# Patient Record
Sex: Female | Born: 1935 | Race: White | Hispanic: No | Smoking: Former smoker
Health system: Southern US, Community
[De-identification: ages and names within clinical notes are randomized; demographics above are authoritative.]

## PROBLEM LIST (undated history)

## (undated) DIAGNOSIS — G971 Other reaction to spinal and lumbar puncture: Secondary | ICD-10-CM

## (undated) DIAGNOSIS — C50919 Malignant neoplasm of unspecified site of unspecified female breast: Secondary | ICD-10-CM

## (undated) DIAGNOSIS — E785 Hyperlipidemia, unspecified: Secondary | ICD-10-CM

## (undated) DIAGNOSIS — M549 Dorsalgia, unspecified: Secondary | ICD-10-CM

## (undated) DIAGNOSIS — R42 Dizziness and giddiness: Secondary | ICD-10-CM

## (undated) DIAGNOSIS — M1990 Unspecified osteoarthritis, unspecified site: Secondary | ICD-10-CM

## (undated) DIAGNOSIS — E039 Hypothyroidism, unspecified: Secondary | ICD-10-CM

## (undated) DIAGNOSIS — Z923 Personal history of irradiation: Secondary | ICD-10-CM

## (undated) DIAGNOSIS — I10 Essential (primary) hypertension: Secondary | ICD-10-CM

## (undated) DIAGNOSIS — G8929 Other chronic pain: Secondary | ICD-10-CM

## (undated) DIAGNOSIS — I7300 Raynaud's syndrome without gangrene: Secondary | ICD-10-CM

## (undated) DIAGNOSIS — R011 Cardiac murmur, unspecified: Secondary | ICD-10-CM

## (undated) DIAGNOSIS — G459 Transient cerebral ischemic attack, unspecified: Secondary | ICD-10-CM

## (undated) DIAGNOSIS — S43004D Unspecified dislocation of right shoulder joint, subsequent encounter: Secondary | ICD-10-CM

## (undated) HISTORY — DX: Malignant neoplasm of unspecified site of unspecified female breast: C50.919

## (undated) HISTORY — DX: Unspecified osteoarthritis, unspecified site: M19.90

## (undated) HISTORY — DX: Transient cerebral ischemic attack, unspecified: G45.9

## (undated) HISTORY — PX: TONSILLECTOMY AND ADENOIDECTOMY: SUR1326

## (undated) HISTORY — PX: FINGER SURGERY: SHX640

## (undated) HISTORY — DX: Hypothyroidism, unspecified: E03.9

## (undated) HISTORY — DX: Hyperlipidemia, unspecified: E78.5

## (undated) HISTORY — DX: Essential (primary) hypertension: I10

---

## 1972-09-19 HISTORY — PX: TOTAL ABDOMINAL HYSTERECTOMY: SHX209

## 1999-07-09 ENCOUNTER — Encounter: Payer: Self-pay | Admitting: *Deleted

## 1999-07-09 ENCOUNTER — Ambulatory Visit (HOSPITAL_COMMUNITY): Admission: RE | Admit: 1999-07-09 | Discharge: 1999-07-09 | Payer: Self-pay | Admitting: *Deleted

## 2000-01-10 ENCOUNTER — Encounter: Payer: Self-pay | Admitting: *Deleted

## 2000-01-10 ENCOUNTER — Encounter: Admission: RE | Admit: 2000-01-10 | Discharge: 2000-01-10 | Payer: Self-pay | Admitting: *Deleted

## 2000-07-13 ENCOUNTER — Encounter: Payer: Self-pay | Admitting: *Deleted

## 2000-07-13 ENCOUNTER — Encounter: Admission: RE | Admit: 2000-07-13 | Discharge: 2000-07-13 | Payer: Self-pay | Admitting: *Deleted

## 2000-11-23 ENCOUNTER — Encounter: Payer: Self-pay | Admitting: Orthopedic Surgery

## 2000-11-23 ENCOUNTER — Ambulatory Visit (HOSPITAL_COMMUNITY): Admission: RE | Admit: 2000-11-23 | Discharge: 2000-11-23 | Payer: Self-pay | Admitting: Orthopedic Surgery

## 2000-12-06 ENCOUNTER — Encounter: Admission: RE | Admit: 2000-12-06 | Discharge: 2001-01-12 | Payer: Self-pay | Admitting: Family Medicine

## 2001-03-20 ENCOUNTER — Emergency Department (HOSPITAL_COMMUNITY): Admission: EM | Admit: 2001-03-20 | Discharge: 2001-03-20 | Payer: Self-pay | Admitting: Emergency Medicine

## 2001-03-20 ENCOUNTER — Encounter: Payer: Self-pay | Admitting: Emergency Medicine

## 2001-07-18 ENCOUNTER — Ambulatory Visit (HOSPITAL_COMMUNITY): Admission: RE | Admit: 2001-07-18 | Discharge: 2001-07-18 | Payer: Self-pay | Admitting: Endocrinology

## 2001-07-18 ENCOUNTER — Encounter: Payer: Self-pay | Admitting: Endocrinology

## 2002-03-25 ENCOUNTER — Ambulatory Visit (HOSPITAL_COMMUNITY): Admission: RE | Admit: 2002-03-25 | Discharge: 2002-03-25 | Payer: Self-pay | Admitting: Gastroenterology

## 2002-07-22 ENCOUNTER — Encounter: Payer: Self-pay | Admitting: Endocrinology

## 2002-07-22 ENCOUNTER — Ambulatory Visit (HOSPITAL_COMMUNITY): Admission: RE | Admit: 2002-07-22 | Discharge: 2002-07-22 | Payer: Self-pay | Admitting: Endocrinology

## 2002-07-29 ENCOUNTER — Encounter: Admission: RE | Admit: 2002-07-29 | Discharge: 2002-07-29 | Payer: Self-pay | Admitting: Endocrinology

## 2002-07-29 ENCOUNTER — Encounter: Payer: Self-pay | Admitting: Endocrinology

## 2003-02-04 ENCOUNTER — Encounter: Payer: Self-pay | Admitting: Endocrinology

## 2003-02-04 ENCOUNTER — Encounter: Admission: RE | Admit: 2003-02-04 | Discharge: 2003-02-04 | Payer: Self-pay | Admitting: Endocrinology

## 2003-08-21 ENCOUNTER — Encounter: Admission: RE | Admit: 2003-08-21 | Discharge: 2003-08-21 | Payer: Self-pay | Admitting: Endocrinology

## 2004-09-27 ENCOUNTER — Encounter: Admission: RE | Admit: 2004-09-27 | Discharge: 2004-09-27 | Payer: Self-pay | Admitting: Endocrinology

## 2004-12-01 ENCOUNTER — Encounter (INDEPENDENT_AMBULATORY_CARE_PROVIDER_SITE_OTHER): Payer: Self-pay | Admitting: *Deleted

## 2004-12-01 ENCOUNTER — Ambulatory Visit (HOSPITAL_BASED_OUTPATIENT_CLINIC_OR_DEPARTMENT_OTHER): Admission: RE | Admit: 2004-12-01 | Discharge: 2004-12-01 | Payer: Self-pay | Admitting: Orthopedic Surgery

## 2005-10-19 ENCOUNTER — Encounter: Admission: RE | Admit: 2005-10-19 | Discharge: 2005-10-19 | Payer: Self-pay | Admitting: Endocrinology

## 2006-10-23 ENCOUNTER — Encounter: Admission: RE | Admit: 2006-10-23 | Discharge: 2006-10-23 | Payer: Self-pay | Admitting: Endocrinology

## 2007-03-22 ENCOUNTER — Encounter: Admission: RE | Admit: 2007-03-22 | Discharge: 2007-03-22 | Payer: Self-pay | Admitting: Endocrinology

## 2007-07-19 ENCOUNTER — Ambulatory Visit (HOSPITAL_BASED_OUTPATIENT_CLINIC_OR_DEPARTMENT_OTHER): Admission: RE | Admit: 2007-07-19 | Discharge: 2007-07-19 | Payer: Self-pay | Admitting: Orthopedic Surgery

## 2007-07-19 ENCOUNTER — Encounter (INDEPENDENT_AMBULATORY_CARE_PROVIDER_SITE_OTHER): Payer: Self-pay | Admitting: Orthopedic Surgery

## 2007-09-20 HISTORY — PX: OTHER SURGICAL HISTORY: SHX169

## 2007-10-26 ENCOUNTER — Encounter: Admission: RE | Admit: 2007-10-26 | Discharge: 2007-10-26 | Payer: Self-pay | Admitting: Endocrinology

## 2008-09-15 ENCOUNTER — Encounter: Admission: RE | Admit: 2008-09-15 | Discharge: 2008-09-15 | Payer: Self-pay | Admitting: Endocrinology

## 2008-12-17 ENCOUNTER — Encounter: Admission: RE | Admit: 2008-12-17 | Discharge: 2008-12-17 | Payer: Self-pay | Admitting: Endocrinology

## 2009-12-18 ENCOUNTER — Encounter: Admission: RE | Admit: 2009-12-18 | Discharge: 2009-12-18 | Payer: Self-pay | Admitting: Endocrinology

## 2010-09-19 HISTORY — PX: BREAST LUMPECTOMY: SHX2

## 2010-10-15 ENCOUNTER — Encounter
Admission: RE | Admit: 2010-10-15 | Discharge: 2010-10-15 | Payer: Self-pay | Source: Home / Self Care | Attending: Endocrinology | Admitting: Endocrinology

## 2010-11-09 DIAGNOSIS — M899 Disorder of bone, unspecified: Secondary | ICD-10-CM | POA: Insufficient documentation

## 2010-11-09 DIAGNOSIS — E039 Hypothyroidism, unspecified: Secondary | ICD-10-CM | POA: Insufficient documentation

## 2010-11-09 DIAGNOSIS — E785 Hyperlipidemia, unspecified: Secondary | ICD-10-CM | POA: Insufficient documentation

## 2010-11-09 DIAGNOSIS — M810 Age-related osteoporosis without current pathological fracture: Secondary | ICD-10-CM | POA: Insufficient documentation

## 2010-11-09 DIAGNOSIS — I10 Essential (primary) hypertension: Secondary | ICD-10-CM | POA: Insufficient documentation

## 2010-11-09 DIAGNOSIS — Z780 Asymptomatic menopausal state: Secondary | ICD-10-CM | POA: Insufficient documentation

## 2010-11-18 DIAGNOSIS — G459 Transient cerebral ischemic attack, unspecified: Secondary | ICD-10-CM

## 2010-11-18 HISTORY — DX: Transient cerebral ischemic attack, unspecified: G45.9

## 2010-11-29 ENCOUNTER — Other Ambulatory Visit: Payer: Self-pay | Admitting: Internal Medicine

## 2010-11-29 DIAGNOSIS — Z1231 Encounter for screening mammogram for malignant neoplasm of breast: Secondary | ICD-10-CM

## 2010-12-12 ENCOUNTER — Emergency Department (HOSPITAL_COMMUNITY): Payer: Medicare Other

## 2010-12-12 ENCOUNTER — Inpatient Hospital Stay (HOSPITAL_COMMUNITY)
Admission: EM | Admit: 2010-12-12 | Discharge: 2010-12-14 | DRG: 066 | Disposition: A | Discharge status: Home / Self Care | Payer: Medicare Other | Attending: Internal Medicine | Admitting: Internal Medicine

## 2010-12-12 DIAGNOSIS — I10 Essential (primary) hypertension: Secondary | ICD-10-CM | POA: Diagnosis present

## 2010-12-12 DIAGNOSIS — I6350 Cerebral infarction due to unspecified occlusion or stenosis of unspecified cerebral artery: Principal | ICD-10-CM | POA: Diagnosis present

## 2010-12-12 DIAGNOSIS — E039 Hypothyroidism, unspecified: Secondary | ICD-10-CM | POA: Diagnosis present

## 2010-12-12 DIAGNOSIS — E785 Hyperlipidemia, unspecified: Secondary | ICD-10-CM | POA: Diagnosis present

## 2010-12-12 DIAGNOSIS — R29898 Other symptoms and signs involving the musculoskeletal system: Secondary | ICD-10-CM | POA: Diagnosis present

## 2010-12-12 LAB — HEMOGLOBIN A1C
Hgb A1c MFr Bld: 6 % — ABNORMAL HIGH (ref <5.7)
Mean Plasma Glucose: 126 mg/dL — ABNORMAL HIGH (ref <117)

## 2010-12-12 LAB — COMPREHENSIVE METABOLIC PANEL
ALT: 14 U/L (ref 0–35)
AST: 22 U/L (ref 0–37)
Albumin: 3.8 g/dL (ref 3.5–5.2)
Alkaline Phosphatase: 59 U/L (ref 39–117)
BUN: 11 mg/dL (ref 6–23)
CO2: 27 mEq/L (ref 19–32)
Calcium: 9.3 mg/dL (ref 8.4–10.5)
Chloride: 105 mEq/L (ref 96–112)
Creatinine, Ser: 0.43 mg/dL (ref 0.4–1.2)
GFR calc Af Amer: >60 mL/min (ref >60)
GFR calc non Af Amer: >60 mL/min (ref >60)
Glucose, Bld: 140 mg/dL — ABNORMAL HIGH (ref 70–99)
Potassium: 3.6 mEq/L (ref 3.5–5.1)
Sodium: 139 mEq/L (ref 135–145)
Total Bilirubin: 0.4 mg/dL (ref 0.3–1.2)
Total Protein: 6.7 g/dL (ref 6.0–8.3)

## 2010-12-12 LAB — URINALYSIS, ROUTINE W REFLEX MICROSCOPIC
Bilirubin Urine: NEGATIVE
Glucose, UA: NEGATIVE mg/dL
Hgb urine dipstick: NEGATIVE
Ketones, ur: NEGATIVE mg/dL
Nitrite: NEGATIVE
Protein, ur: NEGATIVE mg/dL
Specific Gravity, Urine: 1.012 (ref 1.005–1.030)
Urobilinogen, UA: 0.2 mg/dL (ref 0.0–1.0)
pH: 6.5 (ref 5.0–8.0)

## 2010-12-12 LAB — DIFFERENTIAL
Basophils Absolute: 0 10*3/uL (ref 0.0–0.1)
Basophils Relative: 0 % (ref 0–1)
Eosinophils Absolute: 0 10*3/uL (ref 0.0–0.7)
Eosinophils Relative: 0 % (ref 0–5)
Lymphocytes Relative: 20 % (ref 12–46)
Lymphs Abs: 1 10*3/uL (ref 0.7–4.0)
Monocytes Absolute: 0.3 10*3/uL (ref 0.1–1.0)
Monocytes Relative: 6 % (ref 3–12)
Neutro Abs: 3.6 10*3/uL (ref 1.7–7.7)
Neutrophils Relative %: 74 % (ref 43–77)

## 2010-12-12 LAB — CK TOTAL AND CKMB (NOT AT ARMC)
CK, MB: 2.5 ng/mL (ref 0.3–4.0)
Relative Index: INVALID (ref 0.0–2.5)
Total CK: 55 U/L (ref 7–177)

## 2010-12-12 LAB — PROTIME-INR
INR: 0.95 (ref 0.00–1.49)
Prothrombin Time: 12.9 seconds (ref 11.6–15.2)

## 2010-12-12 LAB — CBC
HCT: 40.3 % (ref 36.0–46.0)
Hemoglobin: 13.4 g/dL (ref 12.0–15.0)
MCH: 32.4 pg (ref 26.0–34.0)
MCHC: 33.3 g/dL (ref 30.0–36.0)
MCV: 97.3 fL (ref 78.0–100.0)
Platelets: 273 10*3/uL (ref 150–400)
RBC: 4.14 MIL/uL (ref 3.87–5.11)
RDW: 13.2 % (ref 11.5–15.5)
WBC: 4.9 10*3/uL (ref 4.0–10.5)

## 2010-12-12 LAB — LIPID PANEL
Cholesterol: 182 mg/dL (ref 0–200)
HDL: 80 mg/dL (ref >39)
LDL Cholesterol: 91 mg/dL (ref 0–99)
Total CHOL/HDL Ratio: 2.3 RATIO
Triglycerides: 55 mg/dL (ref <150)
VLDL: 11 mg/dL (ref 0–40)

## 2010-12-12 LAB — TROPONIN I: Troponin I: 0.05 ng/mL (ref 0.00–0.06)

## 2010-12-12 LAB — APTT: aPTT: 28 seconds (ref 24–37)

## 2010-12-13 ENCOUNTER — Other Ambulatory Visit (HOSPITAL_COMMUNITY): Payer: PRIVATE HEALTH INSURANCE

## 2010-12-13 ENCOUNTER — Inpatient Hospital Stay (HOSPITAL_COMMUNITY): Payer: Medicare Other

## 2010-12-13 DIAGNOSIS — I059 Rheumatic mitral valve disease, unspecified: Secondary | ICD-10-CM

## 2010-12-13 DIAGNOSIS — I63239 Cerebral infarction due to unspecified occlusion or stenosis of unspecified carotid arteries: Secondary | ICD-10-CM

## 2010-12-13 LAB — GLUCOSE, CAPILLARY: Glucose-Capillary: 113 mg/dL — ABNORMAL HIGH (ref 70–99)

## 2010-12-13 LAB — LIPID PANEL
Cholesterol: 148 mg/dL (ref 0–200)
HDL: 65 mg/dL (ref >39)
LDL Cholesterol: 68 mg/dL (ref 0–99)
Total CHOL/HDL Ratio: 2.3 RATIO
Triglycerides: 75 mg/dL (ref <150)

## 2010-12-14 LAB — GLUCOSE, CAPILLARY: Glucose-Capillary: 100 mg/dL — ABNORMAL HIGH (ref 70–99)

## 2010-12-16 NOTE — H&P (Signed)
Tanya Morales, AREOLA             ACCOUNT NO.:  [id number]  MEDICAL RECORD NO.:  [id number]           PATIENT TYPE:  E  LOCATION:  WLED                         FACILITY:  [facility]  PHYSICIAN:  Tanya Morales, M.D.   DATE OF BIRTH:  Oct 20, 1928  DATE OF ADMISSION:  12/12/2010 DATE OF DISCHARGE:                             HISTORY & PHYSICAL   CHIEF COMPLAINT:  Weakness of the left arm and left leg.  HISTORY OF PRESENT ILLNESS:  Tanya Morales is a pleasant 77 year old female who is a new patient to me whom I just met in the office in the last month or so.  She had been in her usual state of health until yesterday evening.  She lied down on the couch to take a nap.  When she woke up 30 minutes later, she noticed that her left leg and left arm were weak. She went to bed later and woke up this morning and continued to have left arm and left leg weakness.  She called her friend at 10:00 a.m. who came to see her and noticed that she also had left sided drooping of her face.  Her friend urged her to call 911 but the patient did not agree to this; however, she did agree to let her friend bring her by car to the emergency room.  In the emergency room, she continues to have some objective signs and symptoms of left arm and left leg weakness, although the facial drooping has resolved.  Cranial CT scan is negative for any acute event but clinically she has a right brain infarct and is being admitted for further evaluation and treatment.  PAST MEDICAL HISTORY: 1. Hypertension. 2. Hyperlipidemia. 3. Hypothyroidism. 4. No previous history of MI or stroke. 5. Dislocated right shoulder in the year 2000. 6. No history of rheumatic fever, tuberculosis exposure, blood     transfusion, sexually transmitted diseases in the past.  PAST SURGICAL HISTORY: 1. Hysterectomy and bilateral salpingo-oophorectomy in 1974. 2. She had finger arthritis surgery for cyst that seemed to form in     her joints and she  has had many of these procedures.  ALLERGIES: 1. ACE INHIBITORS. 2. LISINOPRIL which causes a cough.  MEDICATIONS: 1. Synthroid 50 mcg daily. 2. Premarin 0.625 mg daily. 3. Simvastatin 10 mg daily. 4. Benicar 20 mg daily. 5. Maalox twice a day. 6. Multivitamin daily. 7. Vitamin C once daily.  REVIEW OF SYSTEMS:  She has had a little bit of the yellowish productive cough just noted this morning.  Otherwise, she has felt well with no recent chest pains.  No fevers.  She did have a root canal procedure in the last 3 weeks.  She has had normal bowel and bladder function as well.  SOCIAL HISTORY:  She is widowed since 13.  She has 2 sons, one lives in [place] and one lives in [place].  She is 2 granddaughters as well.  She is a nonsmoker but she was a former smoker and quit in 1969. She has no illicit drug use.  She does not use alcohol.  FAMILY HISTORY:  Father died at age 83  of a lung infection.  Mother died at almost 46 years old of CHF.  One younger sister died at age 43 of juvenile rheumatoid arthritis, and she has a younger sister who is in excellent health and her sons are in good health.  PHYSICAL EXAMINATION:  VITAL SIGNS:  Temperature 98.3, blood pressure initially 202/83 and now it is 189/84, pulse 105, sinus tachycardia, respiratory rate 20, 100% oxygen saturation on room air. GENERAL:  She is sitting semi supine in no acute distress.  She is alert and oriented x4. NECK:  She has no JVD. LUNGS:  Clear to auscultation bilaterally with no wheezes, rales, or rhonchi. HEART:  Regular rate and rhythm, except slightly tachycardic but there are no significant murmurs, rubs, or gallops. ABDOMEN:  Soft, nontender, and nondistended.  There is no peripheral edema. NEUROLOGIC:  She moves her extremities x4.  She does have some slight weakness of the left lower extremity to confrontational testing as well as to her left upper extremity.  She has difficulty somewhat with  finger- nose-finger testing on the left.  She has very slight pronator drift noted on the left.  She has cranial nerves II-XII which are normal.  LABORATORY DATA:  Again, cranial CT scan shows no acute finding.  There is some evidence of small vessel disease.  INR is 0.95.  Sodium 139, potassium 3.6, chloride 105, CO2 of 27, BUN 11, creatinine 0.43, and glucose 140.  GFR greater than 60.  Liver function tests are normal. White count 4.9 with a normal differential, hemoglobin 13.4, and platelet count 273,000.  Cranial CT scan again shows no visible cortical infarction or hemorrhage but she does have fairly advanced small vessel ischemic disease.  ASSESSMENT AND PLAN:  A 77 year old female with left-sided weakness and left-sided facial droop.  I strongly suspect that she has had a right brain infarct.  I will admit her to a telemetry bed.  We will place her on oxygen, and we will give her aspirin daily.  We will get MRI, MRA of the brain as well as carotid Dopplers and 2-D echocardiogram.  We will also ask for Neurology consultation.  We will continue her other home medications.  She is a full code status.  We will also ask for Physical Therapy and Occupational Therapy consults.     Tanya Morales A. Tanya Morales, M.D.     MAP/MEDQ  D:  12/12/2010  T:  12/12/2010  Job:  [id number]  Electronically Signed by Rodrigo Ran M.D. on 12/16/2010 09:32:03 AM

## 2010-12-20 NOTE — Discharge Summary (Signed)
NAMECAMILA, Tanya Morales             ACCOUNT NO.:  [id number]  MEDICAL RECORD NO.:  [id number]           PATIENT TYPE:  I  LOCATION:  1443                         FACILITY:  [facility]  PHYSICIAN:  Kaelen Caughlin A. Ashly Goethe, M.D.   DATE OF BIRTH:  05-Jan-1929  DATE OF ADMISSION:  12/12/2010 DATE OF DISCHARGE:  12/14/2010                              DISCHARGE SUMMARY   DISCHARGE DIAGNOSES: 1. Right brain reversible ischemic neurologic deficit with associated     left-sided facial droop as well as left upper extremity and left     lower extremity weakness which completely resolved within     approximately 24 hours of the onset of symptoms. 2. Moderate bilateral carotid artery stenosis, I do not feel that this     contributed to her symptoms and think that her symptoms were most     likely small vessel disease related. 3. Hypertension. 4. Hyperlipidemia. 5. Impaired fasting glucose. 6. Hypothyroidism.  PROCEDURES:  Cranial CT scan done upon admission to the emergency room showed advanced small vessel ischemic changes but no visible cortical infarction or hemorrhage.  MRI and MRA of the head showed chronic microvascular ischemia but no acute infarction.  There was mild focal stenosis in the right M1 segment but no large vessel occlusion.  A 2-D echocardiogram was performed which was essentially normal.  It showed an ejection fraction of 55% to 60%.  There was increased relative contribution of atrial contraction to ventricular filling.  There was trivial aortic valve regurgitation, mild mitral valve regurgitation.  DISCHARGE MEDICATIONS: 1. Tylenol 650 mg every 6 hours as needed for pain or fever. 2. Aspirin 325 mg daily. 3. Simvastatin 20 mg daily in the evening. 4. Calcium magnesium complex 1000 mg. 5. Calcium 500 mg. 6. Magnesium 1 tablet daily. 7. Benicar 20 mg daily. 8. Multivitamin daily. 9. Synthroid 50 mcg daily. 10.Turmeric supplement daily. 11.Vitamin C daily.  She is to  discontinue Premarin forever  HISTORY OF PRESENT ILLNESS:  Lorieann is a pleasant 77 year old female who is a relatively new patient to my practice.  I met her in the office about 1 month ago.  She presented with 12 hours of left-sided leg and left arm weakness and her friend noticed that she had some droopiness of the left side of her face.  She presented to the emergency room at 10 in the morning on Sunday morning of the date of admission.  She did still have some objective weakness on the left side at the time of admission and some slight pronator drift on the left side.  She was therefore admitted for presumptive right brain infarction.  HOSPITAL COURSE:  Angeleena was admitted to a telemetry bed.  She remained in normal sinus rhythm and stable from a cardiac and pulmonary standpoint.  She underwent the diagnostic studies as noted above. Fortunately, her symptoms completely resolved within the first 12 to 24 hours of her admission.  By December 14, 2010, she was deemed stable for discharge home on aspirin therapy and off her Premarin therapy.  DISCHARGE PHYSICAL EXAMINATION:  VITAL SIGNS:  Temperature 98, pulse 64, respiratory rate 20, blood pressure 132/67,  97% saturation on room air. Blood sugar 113. GENERAL:  She was in no acute distress, ambulating without difficulty. LUNGS:  Clear to auscultation bilaterally with no wheezes, rales or rhonchi. HEART:  Regular rate and rhythm with no murmur, rub, or gallop. ABDOMEN:  Soft, nontender, nondistended with no mass.  There was no edema and she had grossly normal strength throughout her upper and lower extremities.  DISCHARGE LABORATORY DATA:  Cholesterol 148, triglycerides 75, HDL 65, LDL 68, total cholesterol to HDL ratio 2.3, hemoglobin W1X 6.0, PTT normal at 28, troponin I normal at 0.05, CK 55.  Urinalysis was negative.  INR 0.95.  Her admission CBC and CMET are on her admission history and physical note.  DISCHARGE INSTRUCTIONS:   Adaijah is to follow a low-salt, heart-healthy diet.  She is to exercise 200 minutes per week.  She is to follow up with Neurology and we will set up a followup visit in 4 weeks just to be sure that they agree with everything that was done during this admission.  She will check her blood pressure 3 times a week and keep a record of this.  She will follow up with Dr. Waynard Edwards in 4 weeks as well. She knows to return to the emergency room or call 911 immediately if she has any recurrence of stroke type symptoms.     Aspen Lawrance A. Waynard Edwards, M.D.     MAP/MEDQ  D:  12/16/2010  T:  12/16/2010  Job:  [id number]  Electronically Signed by Rodrigo Ran M.D. on 12/20/2010 06:15:39 PM

## 2010-12-21 ENCOUNTER — Ambulatory Visit
Admission: RE | Admit: 2010-12-21 | Discharge: 2010-12-21 | Disposition: A | Discharge status: Home / Self Care | Payer: Medicare Other | Source: Ambulatory Visit | Attending: Internal Medicine | Admitting: Internal Medicine

## 2010-12-21 DIAGNOSIS — Z1231 Encounter for screening mammogram for malignant neoplasm of breast: Secondary | ICD-10-CM

## 2010-12-23 ENCOUNTER — Other Ambulatory Visit: Payer: Self-pay | Admitting: Internal Medicine

## 2010-12-23 DIAGNOSIS — R928 Other abnormal and inconclusive findings on diagnostic imaging of breast: Secondary | ICD-10-CM

## 2010-12-28 ENCOUNTER — Ambulatory Visit
Admission: RE | Admit: 2010-12-28 | Discharge: 2010-12-28 | Disposition: A | Discharge status: Home / Self Care | Payer: Medicare Other | Source: Ambulatory Visit | Attending: Internal Medicine | Admitting: Internal Medicine

## 2010-12-28 ENCOUNTER — Other Ambulatory Visit: Payer: Self-pay | Admitting: Internal Medicine

## 2010-12-28 DIAGNOSIS — R928 Other abnormal and inconclusive findings on diagnostic imaging of breast: Secondary | ICD-10-CM

## 2010-12-28 DIAGNOSIS — N6320 Unspecified lump in the left breast, unspecified quadrant: Secondary | ICD-10-CM

## 2011-01-03 ENCOUNTER — Other Ambulatory Visit: Payer: Self-pay | Admitting: Internal Medicine

## 2011-01-03 ENCOUNTER — Ambulatory Visit
Admission: RE | Admit: 2011-01-03 | Discharge: 2011-01-03 | Disposition: A | Discharge status: Home / Self Care | Payer: Medicare Other | Source: Ambulatory Visit | Attending: Internal Medicine | Admitting: Internal Medicine

## 2011-01-03 ENCOUNTER — Other Ambulatory Visit: Payer: Self-pay | Admitting: Radiology

## 2011-01-03 DIAGNOSIS — N6320 Unspecified lump in the left breast, unspecified quadrant: Secondary | ICD-10-CM

## 2011-01-12 ENCOUNTER — Encounter (HOSPITAL_BASED_OUTPATIENT_CLINIC_OR_DEPARTMENT_OTHER): Payer: Medicare Other | Admitting: Oncology

## 2011-01-12 ENCOUNTER — Other Ambulatory Visit: Payer: Self-pay | Admitting: Oncology

## 2011-01-12 DIAGNOSIS — Z170 Estrogen receptor positive status [ER+]: Secondary | ICD-10-CM

## 2011-01-12 DIAGNOSIS — M949 Disorder of cartilage, unspecified: Secondary | ICD-10-CM

## 2011-01-12 DIAGNOSIS — C50919 Malignant neoplasm of unspecified site of unspecified female breast: Secondary | ICD-10-CM

## 2011-01-12 DIAGNOSIS — Z8673 Personal history of transient ischemic attack (TIA), and cerebral infarction without residual deficits: Secondary | ICD-10-CM

## 2011-01-12 DIAGNOSIS — M899 Disorder of bone, unspecified: Secondary | ICD-10-CM

## 2011-01-12 LAB — CBC WITH DIFFERENTIAL/PLATELET
Basophils Absolute: 0 10*3/uL (ref 0.0–0.1)
Eosinophils Absolute: 0.1 10*3/uL (ref 0.0–0.5)
LYMPH%: 36.3 % (ref 14.0–49.7)
MCV: 98.5 fL (ref 79.5–101.0)
MONO%: 8 % (ref 0.0–14.0)
NEUT%: 53 % (ref 38.4–76.8)
Platelets: 284 10*3/uL (ref 145–400)
RDW: 13.1 % (ref 11.2–14.5)
WBC: 4.4 10*3/uL (ref 3.9–10.3)

## 2011-01-12 LAB — COMPREHENSIVE METABOLIC PANEL
Albumin: 4 g/dL (ref 3.5–5.2)
Alkaline Phosphatase: 54 U/L (ref 39–117)
BUN: 14 mg/dL (ref 6–23)
Calcium: 9.3 mg/dL (ref 8.4–10.5)
Creatinine, Ser: 0.62 mg/dL (ref 0.40–1.20)
Glucose, Bld: 105 mg/dL — ABNORMAL HIGH (ref 70–99)
Potassium: 3.6 mEq/L (ref 3.5–5.3)

## 2011-01-20 ENCOUNTER — Other Ambulatory Visit: Payer: Self-pay | Admitting: General Surgery

## 2011-01-20 DIAGNOSIS — C50919 Malignant neoplasm of unspecified site of unspecified female breast: Secondary | ICD-10-CM

## 2011-01-21 DIAGNOSIS — G459 Transient cerebral ischemic attack, unspecified: Secondary | ICD-10-CM | POA: Insufficient documentation

## 2011-02-01 NOTE — Op Note (Signed)
Tanya, Morales             ACCOUNT NO.:  [id number]   MEDICAL RECORD NO.:  [id number]          PATIENT TYPE:  AMB   LOCATION:  DSC                          FACILITY:  [facility]   PHYSICIAN:  Cindee Salt, M.D.       DATE OF BIRTH:  02-19-1929   DATE OF PROCEDURE:  07/19/2007  DATE OF DISCHARGE:                               OPERATIVE REPORT   PREOPERATIVE DIAGNOSIS:  Mucoid cyst, left middle finger.   POSTOPERATIVE DIAGNOSIS:  Mucoid cyst, left middle finger.   OPERATION:  Excision of mucoid cyst, debridement of this in her  phalangeal joint, left middle finger.   SURGEON:  Cindee Salt, M.D.   ASSISTANT:  RN.   ANESTHESIA:  General.   ANESTHESIOLOGIST:  Sheldon Silvan, M.D.   HISTORY:  The patient is a 77 year old female with a history of a mucoid  cyst, left middle finger, this transilluminates.  She has degenerative  changes.  This had enlarged grooving the nail.  It is significantly  enlarged recently.  She desires having this removed.  She is aware of  risks and complications including infection, recurrence, injury to  arteries, nerves, tendons, incomplete relief of symptoms, dystrophy.   In the preoperative area, the patient is seen.  Questions encouraged and  answered.  The extremity marked by both patient and surgeon and  antibiotic given.   PROCEDURE:  The patient was brought to the operating room where general  anesthetic was carried out without difficulty.  She was prepped using  DuraPrep in the supine position, left arm free.  The curvilinear  incision was made over the distal phalangeal joint, carried down through  subcutaneous tissue.  All bleeders were electrocauterized.  A cyst was  immediately apparent distally.  This was dissected free with blunt and  sharp dissection also with the rongeur.  This was removed and sent to  pathology.  The joint was opened.  A large osteophyte was present on the  radial aspect of the joint.  This was removed with the rongeur.  Synovectomy was performed.  Significant synovitis was present.  The  wound was irrigated.  The skin was then closed with interrupted 5-0  Vicryl repeat sutures.  A  sterile compressive dressing and splint to the finger was applied.  The  patient tolerated the procedure well, was taken to the recovery room for  observation in satisfactory condition.   She is discharged home to return to the hand center of [facility] in 1  week on Vicodin.           ______________________________  Cindee Salt, M.D.     GK/MEDQ  D:  07/19/2007  T:  07/19/2007  Job:  [id number]   cc:   Cindee Salt, M.D.

## 2011-02-04 NOTE — Op Note (Signed)
NAMECHAELA, BRANSCUM             ACCOUNT NO.:  [id number]   MEDICAL RECORD NO.:  [id number]          PATIENT TYPE:  AMB   LOCATION:  DSC                          FACILITY:  [facility]   PHYSICIAN:  Cindee Salt, M.D.       DATE OF BIRTH:  [birth year]   DATE OF PROCEDURE:  12/01/2004  DATE OF DISCHARGE:                                 OPERATIVE REPORT   PREOPERATIVE DIAGNOSIS:  Mucoid cyst, right ring finger.   POSTOPERATIVE DIAGNOSIS:  Mucoid cyst, right ring finger.   OPERATION:  Excision mucoid cyst, debridement interphalangeal joint,  rotation dorsal flap, right ring finger.   SURGEON:  Cindee Salt, M.D.   ASSISTANTCarolyne Fiscal.   ANESTHESIA:  General.   HISTORY:  The patient is a 76 year old female with a history of a mucoid  cyst of the right ring finger. This has produced translucent skin over the  area of the cyst.   PROCEDURE:  The patient is brought to the operating room where a general  anesthetic was carried out without difficulty. She was prepped, draped using  DuraPrep, supine position, right arm free. The limb was exsanguinated with  an Esmarch bandage, tourniquet placed high on the arm was inflated to 200  mmHg. A curved incision was made to allow rotation flap, if necessary,  carried down through subcutaneous tissue. The cyst was immediately  encountered, the dissection carried proximally and distally. The cyst was  excised leaving a portion intact with the skin. The dissection carried down  to the joint. The joint opened on the radial side.  Exostosis was removed  with a small rongeur. The entire area was debrided both radial and ulnarly.  The wound was irrigated.  It was decided to proceed with a small dorsal  rotation flap for coverage with excision of the translucent area of skin.  This was performed.  A curvature made proximally to allow a dorsal rotation.  This was done without difficulty, sutured into position with interrupted 5-0  nylon sutures. Sterile  compressive dressing and splint was applied. The  patient tolerated the procedure well and was taken to recovery room for  observation in satisfactory condition. She is discharged home to return to  the [facility] [place] in one week on Vicodin.      GK/MEDQ  D:  12/01/2004  T:  12/01/2004  Job:  [id number]

## 2011-02-21 ENCOUNTER — Encounter (HOSPITAL_BASED_OUTPATIENT_CLINIC_OR_DEPARTMENT_OTHER)
Admission: RE | Admit: 2011-02-21 | Discharge: 2011-02-21 | Disposition: A | Discharge status: Home / Self Care | Payer: Medicare Other | Source: Ambulatory Visit | Attending: General Surgery | Admitting: General Surgery

## 2011-02-21 LAB — BASIC METABOLIC PANEL
CO2: 29 mEq/L (ref 19–32)
Calcium: 9.7 mg/dL (ref 8.4–10.5)
GFR calc Af Amer: >60 mL/min (ref >60)
GFR calc non Af Amer: >60 mL/min (ref >60)
Sodium: 137 mEq/L (ref 135–145)

## 2011-02-23 ENCOUNTER — Other Ambulatory Visit (INDEPENDENT_AMBULATORY_CARE_PROVIDER_SITE_OTHER): Payer: Self-pay | Admitting: General Surgery

## 2011-02-23 ENCOUNTER — Ambulatory Visit
Admission: RE | Admit: 2011-02-23 | Discharge: 2011-02-23 | Disposition: A | Discharge status: Home / Self Care | Payer: Medicare Other | Source: Ambulatory Visit | Attending: General Surgery | Admitting: General Surgery

## 2011-02-23 ENCOUNTER — Other Ambulatory Visit: Payer: Self-pay | Admitting: General Surgery

## 2011-02-23 ENCOUNTER — Ambulatory Visit (HOSPITAL_BASED_OUTPATIENT_CLINIC_OR_DEPARTMENT_OTHER)
Admission: RE | Admit: 2011-02-23 | Discharge: 2011-02-24 | Disposition: A | Discharge status: Home / Self Care | Payer: Medicare Other | Source: Ambulatory Visit | Attending: General Surgery | Admitting: General Surgery

## 2011-02-23 DIAGNOSIS — C50919 Malignant neoplasm of unspecified site of unspecified female breast: Secondary | ICD-10-CM

## 2011-02-23 DIAGNOSIS — I10 Essential (primary) hypertension: Secondary | ICD-10-CM | POA: Insufficient documentation

## 2011-02-23 DIAGNOSIS — Z8673 Personal history of transient ischemic attack (TIA), and cerebral infarction without residual deficits: Secondary | ICD-10-CM | POA: Insufficient documentation

## 2011-02-23 DIAGNOSIS — Z01812 Encounter for preprocedural laboratory examination: Secondary | ICD-10-CM | POA: Insufficient documentation

## 2011-02-23 DIAGNOSIS — D0590 Unspecified type of carcinoma in situ of unspecified breast: Secondary | ICD-10-CM | POA: Insufficient documentation

## 2011-02-23 HISTORY — PX: BREAST SURGERY: SHX581

## 2011-02-24 NOTE — Op Note (Signed)
NAMEGLADINE, Tanya Morales             ACCOUNT NO.:  [id number]  MEDICAL RECORD NO.:  [id number]  LOCATION:  1443                         [facility]ACILITY:  [facility]  PHYSICIAN:  Juanetta Gosling, MDDATE OF BIRTH:  05-07-1929  DATE OF PROCEDURE:  02/23/2011 DATE OF DISCHARGE:  12/14/2010                              OPERATIVE REPORT   PREOPERATIVE DIAGNOSIS:  Clinical stage I left breast cancer.  POSTOPERATIVE DIAGNOSIS:  Clinical stage I left breast cancer.  PROCEDURE:  Left breast wire-guided lumpectomy.  SURGEON:  Troy Sine. Dwain Sarna, MD  ASSISTANT:  None.  ANESTHESIA:  General.  SUPERVISING ANESTHESIOLOGIST:  Guadalupe Maple, MD  SPECIMENS:  Left breast tissue marked with paint kit plus additional inferior margin.  DISPOSITION OF SPECIMENS:  Pathology.  ESTIMATED BLOOD LOSS:  Minimal.  COMPLICATIONS:  None.  DRAINS:  None.  DISPOSITION:  The patient to recovery room in stable condition.  INDICATIONS:  This is a 77 year old female with a history of a recent stroke who presented after undergoing a mammogram which showed a possible mass in the left breast.  Diagnostic mammogram confirmed a 1.7- cm mass and ultrasound showed 1.4 x 0.9 x 0.8 cm mass.  This was proven to be invasive ductal carcinoma.  She was [facility] at [facility] with a decision being to proceed with breast conservation therapy without a need for sentinel node evaluation.  PROCEDURE IN DETAILS:  After informed consent was obtained, the patient was taken to the breast center where she had a wire placed.  She had an X placed over the lesion as well by Dr. Britta Mccreedy.  I had the mammogram of it and description available for my review in the operating room.  She was administered 1 g of intravenous cefazolin.  Sequential compression devices were placed on lower extremities prior to induction with anesthesia.  She was then placed under general anesthesia with an LMA.  Her left breast was  prepped and draped in a standard sterile surgical fashion.  A surgical time-out was then performed.  I made a curvilinear incision overlying the mass.  I used cautery to remove the mass.  The wire was pulled in remotely and this was removed down to the pectoralis muscle including the pectoralis fascia.  This was all removed and marked with a paint kit in the standard fashion. Faxitron mammogram was taken and confirmed removal of the clip, the mass, and the wire.  It appeared that the margins were clear.  There was a little bit more abnormal tissue inferiorly, so I did take a little bit more inferior margin as well and this was marked with a short stitch superior, double stitch deep and single long stitch at the new inferior margin in the area of the cancer.  Hemostasis was obtained.  I placed 2 clips deep on the muscle and 1 clip in each cardinal position of the cavity.  I then closed the deep breast tissue with 2-0 Vicryl, the dermis with 3-0 Vicryl, and the skin with 4-0 Monocryl.  Marcaine plain 10 mL of 0.25% was injected.  Steri-Strips and sterile dressing were placed.  She was extubated in the operating room.  She is going to remain overnight  just due to the fact that she has a recent stroke for observation.     Juanetta Gosling, MD     MCW/MEDQ  D:  02/23/2011  T:  02/24/2011  Job:  [id number]  cc:   Loraine Leriche A. Waynard Edwards, M.D. Dollene Cleveland, M.D. Lowella Dell, M.D. Billie Lade, Ph.D., M.D.  Electronically Signed by Emelia Loron MD on 02/24/2011 04:41:09 PM

## 2011-03-09 ENCOUNTER — Ambulatory Visit
Admission: RE | Admit: 2011-03-09 | Discharge: 2011-03-09 | Disposition: A | Discharge status: Home / Self Care | Payer: Medicare Other | Source: Ambulatory Visit | Attending: Radiation Oncology | Admitting: Radiation Oncology

## 2011-03-09 DIAGNOSIS — Z510 Encounter for antineoplastic radiation therapy: Secondary | ICD-10-CM | POA: Insufficient documentation

## 2011-03-09 DIAGNOSIS — C50919 Malignant neoplasm of unspecified site of unspecified female breast: Secondary | ICD-10-CM | POA: Insufficient documentation

## 2011-03-14 ENCOUNTER — Encounter (INDEPENDENT_AMBULATORY_CARE_PROVIDER_SITE_OTHER): Payer: Self-pay | Admitting: General Surgery

## 2011-03-14 ENCOUNTER — Ambulatory Visit (INDEPENDENT_AMBULATORY_CARE_PROVIDER_SITE_OTHER): Payer: Medicare Other | Admitting: General Surgery

## 2011-03-14 DIAGNOSIS — Z09 Encounter for follow-up examination after completed treatment for conditions other than malignant neoplasm: Secondary | ICD-10-CM | POA: Insufficient documentation

## 2011-03-14 DIAGNOSIS — C50919 Malignant neoplasm of unspecified site of unspecified female breast: Secondary | ICD-10-CM

## 2011-03-14 HISTORY — DX: Malignant neoplasm of unspecified site of unspecified female breast: C50.919

## 2011-03-14 NOTE — Progress Notes (Signed)
Subjective:     Patient ID: Tanya Morales, female   DOB: [birth year], 77 y.o.   MRN: [id number]    There were no vitals taken for this visit.    HPIThis is a 77 year old female who was diagnosed with a stage I left breast cancer clinically in April. She has since undergone a left breast wire-guided lumpectomy after being evaluated at breast multidisciplinary conference. She did well with this and reports no complaints except for some occasional soreness at her surgical site. Her pathology has returned as a 1.1 cm grade 1 invasive ductal carcinoma with associated low-grade ductal carcinoma in situ. Her margins are negative. Her estrogen receptors are positive at 94% and her progesterone receptors are positive at 61%. Her Ki 67 is 9%. HER-2/neu is negative.   Review of Systems     Objective:   Physical ExamLeft breast incision clean without infection      Assessment:     Stage I left breast cancer s/p lumpectomy    Plan:     We reviewed her pathology results and adjuvant therapy. She is due to begin radiation therapy in the next couple week. I asked her to come see me a month after finishing radiation therapy. She is released to full activity.

## 2011-04-12 ENCOUNTER — Encounter (HOSPITAL_BASED_OUTPATIENT_CLINIC_OR_DEPARTMENT_OTHER): Payer: Medicare Other | Admitting: Oncology

## 2011-04-12 DIAGNOSIS — C50919 Malignant neoplasm of unspecified site of unspecified female breast: Secondary | ICD-10-CM

## 2011-04-12 DIAGNOSIS — M899 Disorder of bone, unspecified: Secondary | ICD-10-CM

## 2011-04-12 DIAGNOSIS — Z170 Estrogen receptor positive status [ER+]: Secondary | ICD-10-CM

## 2011-06-14 ENCOUNTER — Ambulatory Visit
Admission: RE | Admit: 2011-06-14 | Discharge: 2011-06-14 | Disposition: A | Discharge status: Home / Self Care | Payer: Medicare Other | Source: Ambulatory Visit | Attending: Radiation Oncology | Admitting: Radiation Oncology

## 2011-06-20 ENCOUNTER — Ambulatory Visit (INDEPENDENT_AMBULATORY_CARE_PROVIDER_SITE_OTHER): Payer: Medicare Other | Admitting: General Surgery

## 2011-06-20 ENCOUNTER — Encounter (INDEPENDENT_AMBULATORY_CARE_PROVIDER_SITE_OTHER): Payer: Self-pay | Admitting: General Surgery

## 2011-06-20 VITALS — BP 148/76 | HR 76 | Temp 97.4°F | Resp 12 | Ht 62.0 in | Wt 133.4 lb

## 2011-06-20 DIAGNOSIS — Z853 Personal history of malignant neoplasm of breast: Secondary | ICD-10-CM

## 2011-06-20 NOTE — Progress Notes (Signed)
Subjective:     Patient ID: Tanya Morales, female   DOB: July 15, 1929, 77 y.o.   MRN: [id number]  HPI This is an 77 year old female who had a T1 C breast cancer that was treated with a lumpectomy in June of 2012. Her pathology report showed invasive ductal carcinoma measuring 1.1 cm with negative margins. We elected to not evaluate her lymph nodes this was not going to change her treatment up front. She has now completed her radiation therapy with Dr. Roselind Messier. She is also seen Dr. Darnelle Catalan for evaluation for any further adjuvant therapy and she is not going to undergo this. She returns today with no complaints.  Review of Systems     Objective:   Physical Exam  Constitutional: She appears well-developed and well-nourished.  Pulmonary/Chest: Right breast exhibits no inverted nipple, no mass, no nipple discharge, no skin change and no tenderness. Left breast exhibits no inverted nipple, no mass, no nipple discharge, no skin change and no tenderness. Breasts are symmetrical.    Lymphadenopathy:    She has no cervical adenopathy.    She has no axillary adenopathy.       Right: No supraclavicular adenopathy present.       Left: No supraclavicular adenopathy present.       Assessment:     Stage I left breast cancer s/p lumpectomy, xrt    Plan:     She is doing very well now. She is due for followup in April and May with medical and radiation oncology. I told her that I will be happy to see her in a year. I also told her I be happy to alternate visits with them. If she really just needs an exam every 6 months for the time being if she can see other physicians I will follow her up as needed. She is also due for a mammogram in April of 2013 and she understands this as well. She is going to continue her own exams and call me back if needed sooner than a year.

## 2011-06-29 LAB — POCT HEMOGLOBIN-HEMACUE
Hemoglobin: 16.7 — ABNORMAL HIGH
Operator id: 112821

## 2011-08-17 DIAGNOSIS — R55 Syncope and collapse: Secondary | ICD-10-CM | POA: Insufficient documentation

## 2011-08-17 DIAGNOSIS — G4700 Insomnia, unspecified: Secondary | ICD-10-CM | POA: Insufficient documentation

## 2011-08-23 ENCOUNTER — Telehealth: Payer: Self-pay | Admitting: Oncology

## 2011-08-23 NOTE — Telephone Encounter (Signed)
called pt to scheduled appts for [id number] and she stated that she does not see the need for this appt

## 2011-09-20 HISTORY — PX: EYE SURGERY: SHX253

## 2011-09-28 DIAGNOSIS — H02059 Trichiasis without entropian unspecified eye, unspecified eyelid: Secondary | ICD-10-CM | POA: Diagnosis not present

## 2011-11-03 DIAGNOSIS — I10 Essential (primary) hypertension: Secondary | ICD-10-CM | POA: Diagnosis not present

## 2011-11-03 DIAGNOSIS — J0100 Acute maxillary sinusitis, unspecified: Secondary | ICD-10-CM | POA: Diagnosis not present

## 2011-11-03 DIAGNOSIS — R05 Cough: Secondary | ICD-10-CM | POA: Diagnosis not present

## 2011-11-18 DIAGNOSIS — I10 Essential (primary) hypertension: Secondary | ICD-10-CM | POA: Diagnosis not present

## 2011-11-18 DIAGNOSIS — G459 Transient cerebral ischemic attack, unspecified: Secondary | ICD-10-CM | POA: Diagnosis not present

## 2011-11-18 DIAGNOSIS — E785 Hyperlipidemia, unspecified: Secondary | ICD-10-CM | POA: Diagnosis not present

## 2011-11-18 DIAGNOSIS — E039 Hypothyroidism, unspecified: Secondary | ICD-10-CM | POA: Diagnosis not present

## 2011-11-22 ENCOUNTER — Other Ambulatory Visit: Payer: Self-pay | Admitting: Internal Medicine

## 2011-11-22 DIAGNOSIS — Z853 Personal history of malignant neoplasm of breast: Secondary | ICD-10-CM

## 2011-11-22 DIAGNOSIS — Z9889 Other specified postprocedural states: Secondary | ICD-10-CM

## 2011-12-05 ENCOUNTER — Encounter: Payer: Self-pay | Admitting: Radiation Oncology

## 2011-12-05 ENCOUNTER — Ambulatory Visit
Admission: RE | Admit: 2011-12-05 | Discharge: 2011-12-05 | Disposition: A | Discharge status: Home / Self Care | Payer: Medicare Other | Source: Ambulatory Visit | Attending: Radiation Oncology | Admitting: Radiation Oncology

## 2011-12-05 VITALS — BP 186/90 | HR 82 | Temp 97.5°F | Wt 133.3 lb

## 2011-12-05 DIAGNOSIS — C50919 Malignant neoplasm of unspecified site of unspecified female breast: Secondary | ICD-10-CM

## 2011-12-05 NOTE — Progress Notes (Signed)
CC:   Juanetta Gosling, MD Lowella Dell, M.D. Redge Gainer. Perini, M.D.  DIAGNOSIS:  Left breast cancer.  INTERVAL SINCE RADIATION THERAPY:  7 months.  NARRATIVE:  Tanya Morales comes in today for routine followup.  She clinically seems to be doing well at this time.  The patient occasionally will have some sharp shooting pains within the breast, but nothing consistent.  She denies any nipple discharge or bleeding.  The patient continues to follow up with Dr. Dwain Sarna and Dr. Darnelle Catalan.  The patient denies any problems with swelling in her left arm or hand.  EXAMINATION:  The patient's temp is 97.5, pulse is 82, blood pressure is 186/90.  The patient's weight is 133 pounds.  Examination of the neck and supraclavicular region reveals no evidence of adenopathy.  The axillary areas are free of adenopathy.  Examination of the lungs reveals them to be clear.  The heart has a regular rhythm and rate.  Examination of the right breast reveals no mass or nipple discharge.  Examination of the left breast reveals mild hyperpigmentation changes.  There is mild induration at the lumpectomy site in the upper outer aspect of the breast, but no dominant masses appreciated in the breast, nipple discharge or bleeding.  IMPRESSION/PLAN:  Clinically no evidence of disease.  In light of the patient's close followup with Dr. Dwain Sarna and Dr. Darnelle Catalan, I have not scheduled Tanya Morales for formal followup appointments, but would be glad to see her at any time.    ______________________________ Billie Lade, Ph.D., M.D. JDK/MEDQ  D:  12/05/2011  T:  12/05/2011  Job:  [id number]

## 2011-12-05 NOTE — Progress Notes (Signed)
Here for routine follow up post radiation of left breast  Completed 05/03/2012. Has intermittent pain of left arm. Itching of left nipple resolved with application of radiaplex gel.

## 2011-12-23 DIAGNOSIS — D239 Other benign neoplasm of skin, unspecified: Secondary | ICD-10-CM | POA: Diagnosis not present

## 2011-12-23 DIAGNOSIS — L570 Actinic keratosis: Secondary | ICD-10-CM | POA: Diagnosis not present

## 2011-12-23 DIAGNOSIS — L821 Other seborrheic keratosis: Secondary | ICD-10-CM | POA: Diagnosis not present

## 2011-12-23 DIAGNOSIS — L659 Nonscarring hair loss, unspecified: Secondary | ICD-10-CM | POA: Diagnosis not present

## 2012-01-02 IMAGING — MG MM DIGITAL DIAG LTD L {BCG}
2 series · 2 of 2 positions shown · non-contrast
Comparison: Prior studies

***ADDENDUM*** CREATED: 12/28/2010 [DATE]

Ultrasound of the left axilla demonstrates no adenopathy.
Addended by:  Bungi Gasa, M.D. on 12/28/2010 [DATE].
***END ADDENDUM*** SIGNED BY: Bungi Gasa, M.D.
CLINICAL DATA: Recall from screening mammography.
DIGITAL DIAGNOSTIC LEFT BREAST MAMMOGRAM  AND LEFT BREAST
ULTRASOUND:

[L CC]
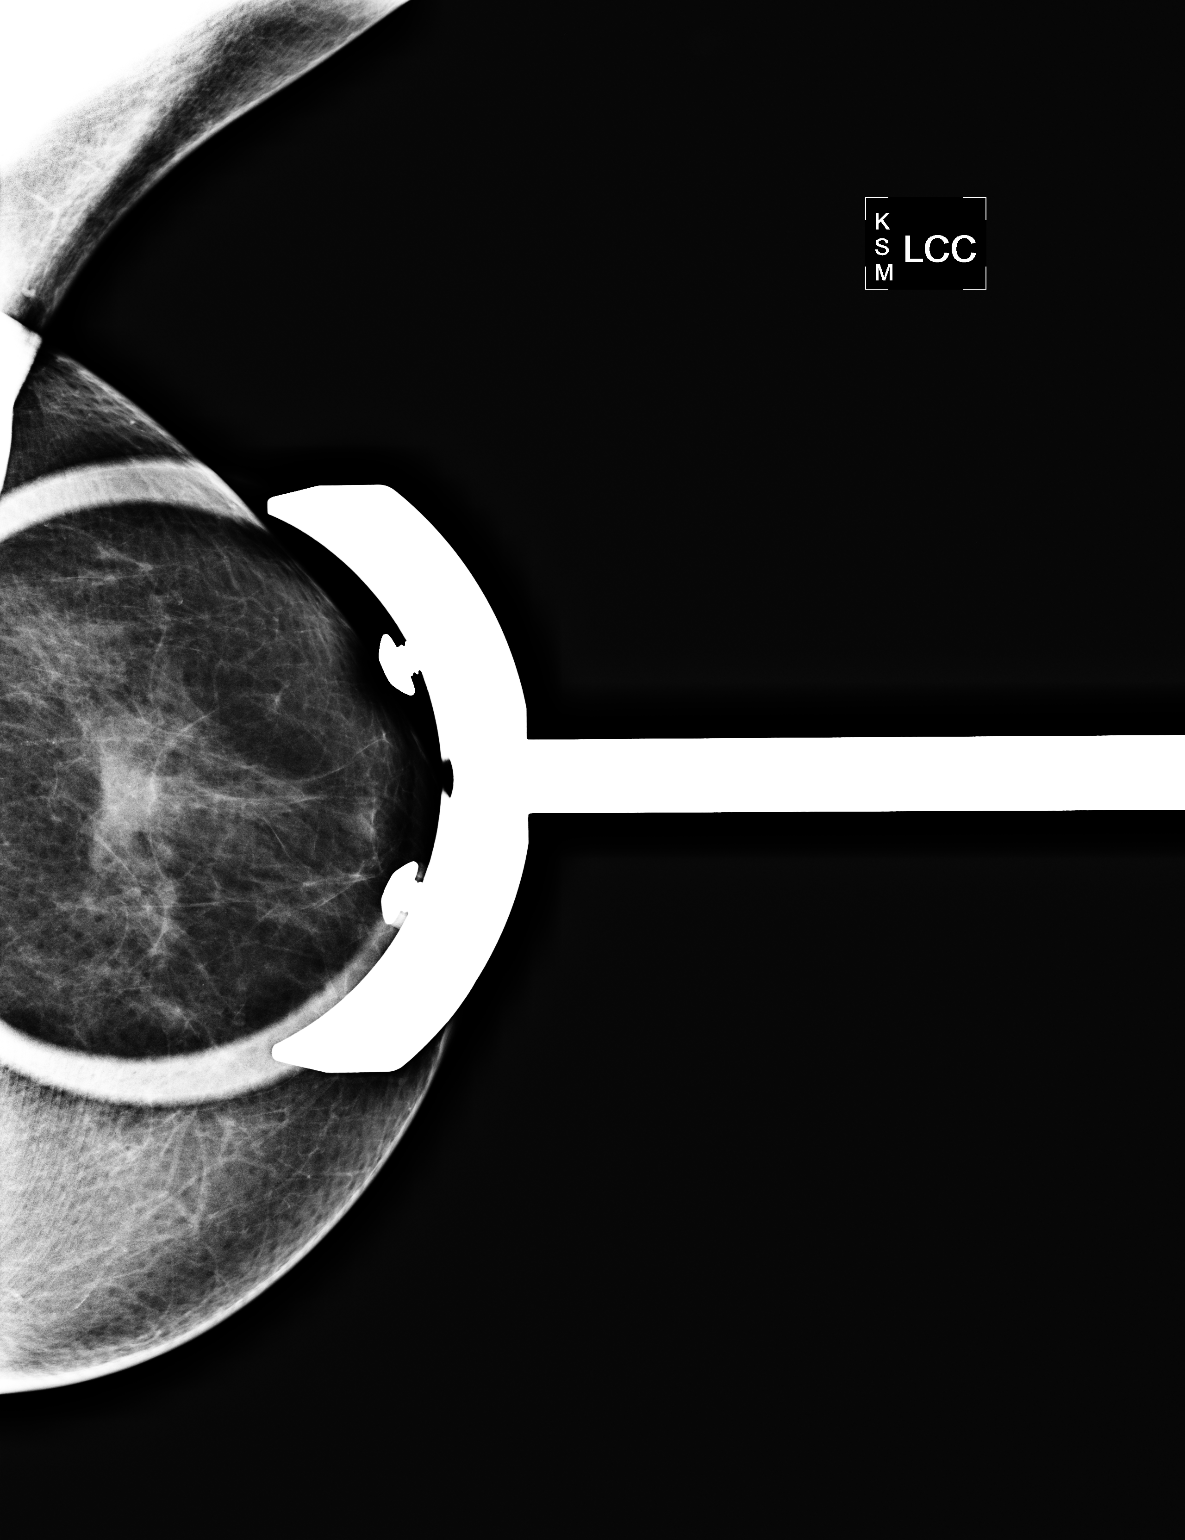

[L MLO]
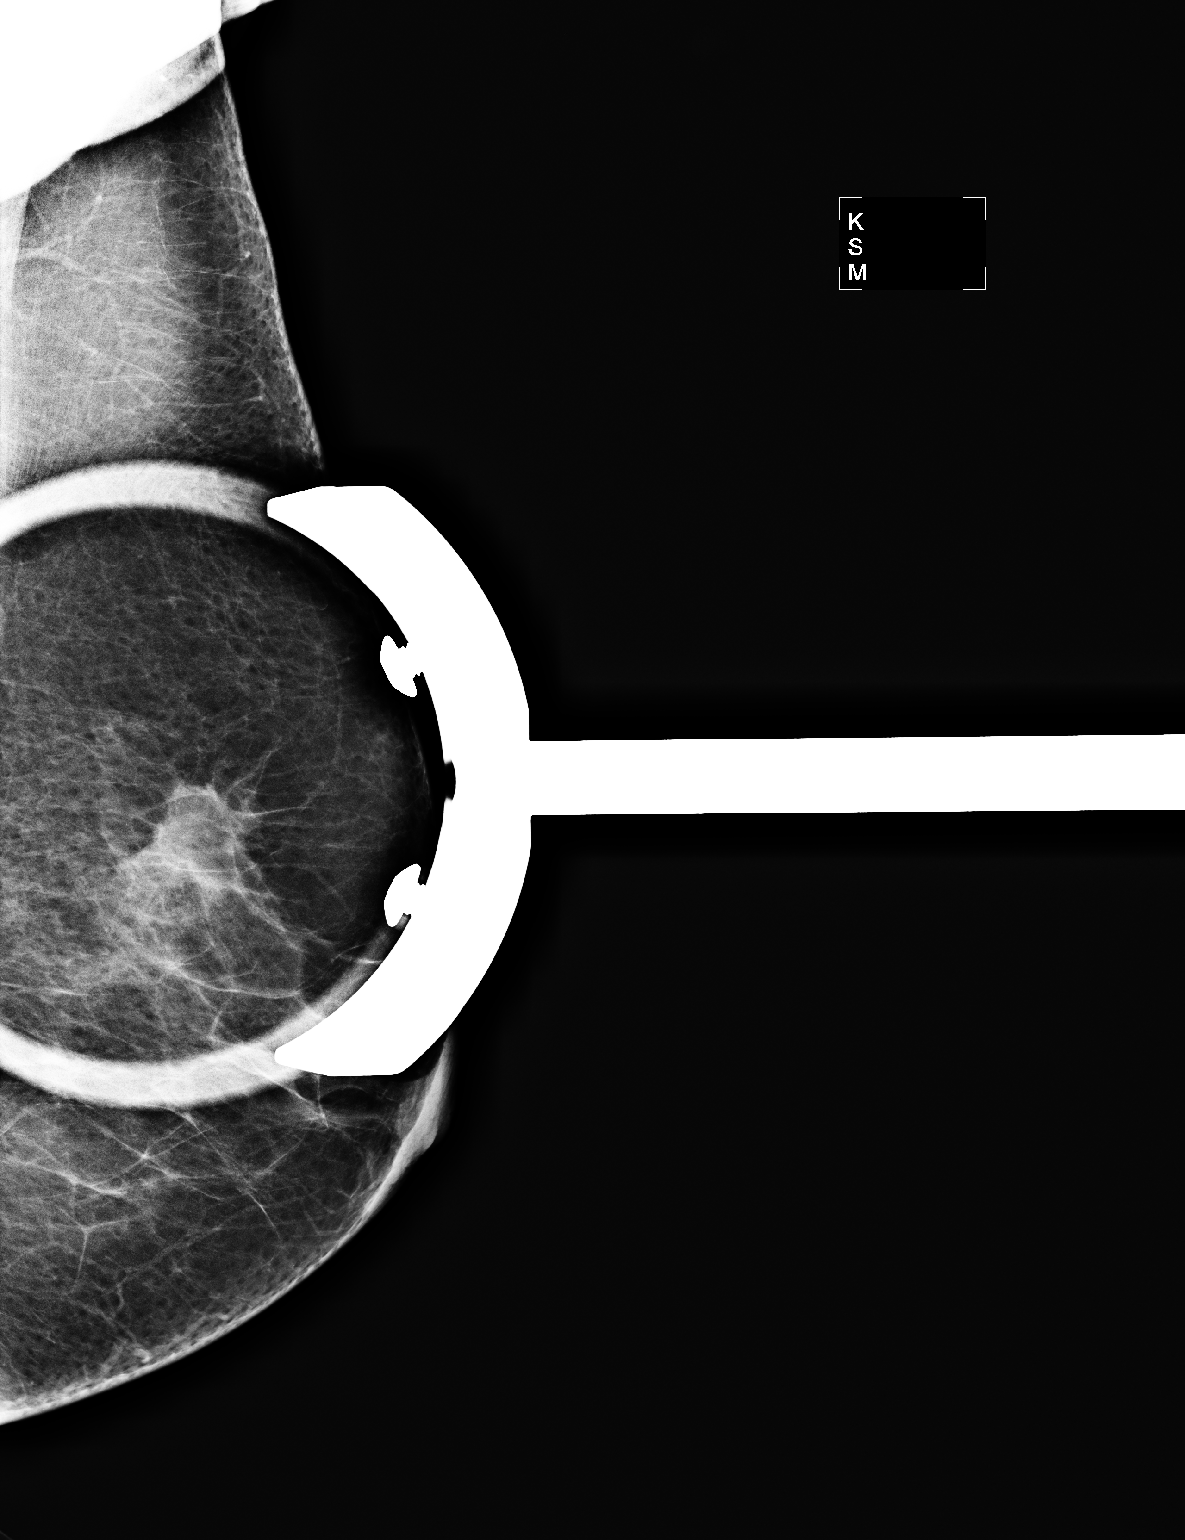

[2 of 2 positions shown; findings below may reference images not displayed]

FINDINGS: Spot compression views of the left breast demonstrate a
persistent ill-defined mass located superiorly within the left
breast at the 12 o'clock position.  By mammography this measures
approximate 1.7 cm in size.

On physical exam, there is a firm area of thickening located
superiorly within the left breast at the 12 o'clock position 5 cm
from the nipple.

Ultrasound is performed, showing an irregular hypoechoic mass
located within the left breast at the 12 o'clock position 5 cm from
the nipple measuring 1.4 x 0.9 x 0.8 cm in size.  This is worrisome
for possible developing carcinoma of the breast.  Tissue sampling
is recommended.  I have discussed ultrasound-guided core biopsy of
the mass with the patient.  This will be scheduled per patient
preference.
IMPRESSION: New mass located within the left breast at the 12 o'clock position
which by ultrasound measures 1.4 cm in size.  Tissue sampling is
recommended.  Ultrasound-guided core biopsy will be scheduled.

BI-RADS CATEGORY 4:  Suspicious abnormality - biopsy should be
considered.

## 2012-01-04 DIAGNOSIS — H0019 Chalazion unspecified eye, unspecified eyelid: Secondary | ICD-10-CM | POA: Diagnosis not present

## 2012-01-18 ENCOUNTER — Other Ambulatory Visit: Payer: Medicare Other | Admitting: Lab

## 2012-01-24 ENCOUNTER — Ambulatory Visit: Payer: Medicare Other | Admitting: Oncology

## 2012-02-16 DIAGNOSIS — L659 Nonscarring hair loss, unspecified: Secondary | ICD-10-CM | POA: Diagnosis not present

## 2012-02-28 ENCOUNTER — Ambulatory Visit
Admission: RE | Admit: 2012-02-28 | Discharge: 2012-02-28 | Disposition: A | Discharge status: Home / Self Care | Payer: Medicare Other | Source: Ambulatory Visit | Attending: Internal Medicine | Admitting: Internal Medicine

## 2012-02-28 DIAGNOSIS — Z853 Personal history of malignant neoplasm of breast: Secondary | ICD-10-CM

## 2012-02-28 DIAGNOSIS — Z9889 Other specified postprocedural states: Secondary | ICD-10-CM

## 2012-03-13 ENCOUNTER — Other Ambulatory Visit: Payer: Medicare Other | Admitting: Lab

## 2012-03-13 ENCOUNTER — Ambulatory Visit: Payer: Medicare Other | Admitting: Oncology

## 2012-03-27 DIAGNOSIS — H2510 Age-related nuclear cataract, unspecified eye: Secondary | ICD-10-CM | POA: Diagnosis not present

## 2012-04-03 DIAGNOSIS — R634 Abnormal weight loss: Secondary | ICD-10-CM | POA: Diagnosis not present

## 2012-04-03 DIAGNOSIS — I10 Essential (primary) hypertension: Secondary | ICD-10-CM | POA: Diagnosis not present

## 2012-04-03 DIAGNOSIS — D539 Nutritional anemia, unspecified: Secondary | ICD-10-CM | POA: Insufficient documentation

## 2012-04-03 DIAGNOSIS — R197 Diarrhea, unspecified: Secondary | ICD-10-CM | POA: Diagnosis not present

## 2012-04-09 DIAGNOSIS — R197 Diarrhea, unspecified: Secondary | ICD-10-CM | POA: Diagnosis not present

## 2012-04-09 DIAGNOSIS — D7589 Other specified diseases of blood and blood-forming organs: Secondary | ICD-10-CM | POA: Diagnosis not present

## 2012-04-09 DIAGNOSIS — D539 Nutritional anemia, unspecified: Secondary | ICD-10-CM | POA: Diagnosis not present

## 2012-04-12 ENCOUNTER — Ambulatory Visit: Payer: Medicare Other | Admitting: Oncology

## 2012-04-16 ENCOUNTER — Other Ambulatory Visit: Payer: Medicare Other

## 2012-04-16 ENCOUNTER — Ambulatory Visit (HOSPITAL_BASED_OUTPATIENT_CLINIC_OR_DEPARTMENT_OTHER): Payer: Medicare Other | Admitting: Oncology

## 2012-04-16 VITALS — BP 163/78 | HR 80 | Temp 97.9°F | Ht 62.0 in | Wt 128.3 lb

## 2012-04-16 DIAGNOSIS — C50919 Malignant neoplasm of unspecified site of unspecified female breast: Secondary | ICD-10-CM

## 2012-04-16 DIAGNOSIS — R609 Edema, unspecified: Secondary | ICD-10-CM

## 2012-04-16 NOTE — Progress Notes (Signed)
ID: TASMIA BLUMER   DOB: 1929/07/19  MR#: [id number]  WUJ#:[id number]  HISTORY OF PRESENT ILLNESS: Sharnae had routine mammography December 18, 2009, which was unremarkable.  Repeat exam December 21, 2010, showed a possible mass in the left breast.  She was recalled for additional views April 10 at the [facility] and Dr. Jean Rosenthal found a firm area of thickening superiorly in the left breast at the 12 o'clock position.  Spot compression views demonstrated an ill-defined mass in that area measuring 1.7 cm mammographically and by ultrasound this was irregular and hypoechoic, measuring maximally 1.4 cm.  Biopsy was performed January 03, 2011, and showed ([id number]) a low-grade invasive ductal carcinoma, with a prognostic panel pending. Her subsequent history is as detailed below.  INTERVAL HISTORY: Chinenye returns today for routine followup of her breast cancer. She has been doing quite a bit of traveling, to [place] and locally, and is planning a trip to [place]. Overall she is doing terrific. "I don't know why I should be here".   REVIEW OF SYSTEMS: She has had a couple of episodes of diarrhea, which have not been disabling. She has been able easily to keep herself hydrated by mouth. She has occasional muscle aches, low back pain, and other joints and bone pains which are intermittent, and not more intense or frequent than prior. She has some "tendinitis" involving her feet, and this has kept her from exercising, she says. She does have a walker at home. Otherwise a detailed review of systems is noncontributory.  PAST MEDICAL HISTORY: Past Medical History  Diagnosis Date  . Hypothyroidism   . Hypertension   . Arthritis   . Breast cancer, stage 1 03/14/2011    left  . Hyperlipidemia   . TIA (transient ischemic attack) march 2012    PAST SURGICAL HISTORY: Past Surgical History  Procedure Date  . Total abdominal hysterectomy   . Finger surgery   . Breast surgery 02/23/11    left lumpectomy  .  Tonsillectomy and adenoidectomy     FAMILY HISTORY Family History  Problem Relation Age of Onset  . Cancer Maternal Grandmother   . Heart disease Mother   The patient's father died at the age of 32 following a "cold."  The patient's mother died at the age of 29 with congestive heart failure.  The patient has 1 brother.  She had 2 sisters.  One of them died at the age of 72 from juvenile rheumatoid arthritis.  There is no history of breast or ovarian cancer in the immediate family.  There is a history of colon cancer in a maternal grandmother.  GYNECOLOGIC HISTORY: She had menarche at age 83.  First child to term at age 71.  She underwent her hysterectomy and bilateral salpingo-oophorectomy as noted in 1974 and took hormone replacement for approximately 25 years.  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  Her husband died from colon cancer in 61.  Her 2 children are Jeffie Pollock, who works as a Equities trader in [place], [place], and [address], who works in [place], [place], for [employer].  The patient has 2 granddaughters.  She attends [employer].   ADVANCED DIRECTIVES: in place  HEALTH MAINTENANCE: History  Substance Use Topics  . Smoking status: Former Games developer  . Smokeless tobacco: Not on file   Comment: quit 1969  . Alcohol Use: No     Colonoscopy:  PAP:  Bone density:  Lipid panel:  Allergies  Allergen Reactions  .  Celebrex (Celecoxib)   . Epinephrine     Current Outpatient Prescriptions  Medication Sig Dispense Refill  . amLODipine (NORVASC) 5 MG tablet Take 5 mg by mouth daily.        Marland Kitchen aspirin 325 MG tablet Take 325 mg by mouth daily.        . Calcium-Magnesium (CAL-MAG) 500-250 MG TABS Take by mouth.        . co-enzyme Q-10 50 MG capsule Take 200 mg by mouth daily.      Marland Kitchen levothyroxine (SYNTHROID, LEVOTHROID) 50 MCG tablet Take 50 mcg by mouth daily.        . Multiple Vitamin (MULTIVITAMIN) capsule Take 1 capsule by mouth daily.         Marland Kitchen olmesartan (BENICAR) 20 MG tablet Take 20 mg by mouth daily.        . simvastatin (ZOCOR) 20 MG tablet Take 10 mg by mouth at bedtime.       . vitamin C (ASCORBIC ACID) 500 MG tablet Take 500 mg by mouth daily.          OBJECTIVE: Elderly white woman who appears well Filed Vitals:   04/16/12 1523  BP: 163/78  Pulse: 80  Temp: 97.9 F (36.6 C)     Body mass index is 23.47 kg/(m^2).    ECOG FS: 1  Sclerae unicteric Oropharynx clear No cervical or supraclavicular adenopathy Lungs no rales or rhonchi Heart regular rate and rhythm Abd benign MSK no focal spinal tenderness, minimal chronic lower extremity edema Neuro: nonfocal Breasts: The right breast is unremarkable; the left breast status post lumpectomy and radiation, there is no evidence of local recurrence.  LAB RESULTS:  Recent labs at the [facility] medical showed a white cell count of 3.6, ANC 1.8, hemoglobin 13.5, MCV 101.5, and platelets 282,000.  Lab Results  Component Value Date   WBC 4.4 01/12/2011   NEUTROABS 2.3 01/12/2011   HGB 13.7 02/23/2011   HCT 40.9 01/12/2011   MCV 98.5 01/12/2011   PLT 284 01/12/2011      Chemistry      Component Value Date/Time   NA 137 02/21/2011 1600   K 3.8 02/21/2011 1600   CL 100 02/21/2011 1600   CO2 29 02/21/2011 1600   BUN 13 02/21/2011 1600   CREATININE 0.48 02/21/2011 1600      Component Value Date/Time   CALCIUM 9.7 02/21/2011 1600   ALKPHOS 54 01/12/2011 1246   AST 22 01/12/2011 1246   ALT 16 01/12/2011 1246   BILITOT 0.7 01/12/2011 1246       Lab Results  Component Value Date   LABCA2 42* 01/12/2011    No components found with this basename: [id number]    No results found for this basename: INR:1;PROTIME:1 in the last 168 hours  Urinalysis    Component Value Date/Time   COLORURINE YELLOW 12/12/2010 1633   APPEARANCEUR CLEAR 12/12/2010 1633   LABSPEC 1.012 12/12/2010 1633   PHURINE 6.5 12/12/2010 1633   GLUCOSEU NEGATIVE 12/12/2010 1633   HGBUR NEGATIVE 12/12/2010 1633    BILIRUBINUR NEGATIVE 12/12/2010 1633   KETONESUR NEGATIVE 12/12/2010 1633   PROTEINUR NEGATIVE 12/12/2010 1633   UROBILINOGEN 0.2 12/12/2010 1633   NITRITE NEGATIVE 12/12/2010 1633   LEUKOCYTESUR NEGATIVE MICROSCOPIC NOT DONE ON URINES WITH NEGATIVE PROTEIN, BLOOD, LEUKOCYTES, NITRITE, OR GLUCOSE <1000 mg/dL. 12/12/2010 1633    STUDIES: Mammography June 2013 was unremarkable.  ASSESSMENT: 78 y.o. [place] woman status post left lumpectomy February 23, 2011, for a pT1c cN0  invasive ductal carcinoma, grade 1,  with ample margins, no evidence of lymphovascular invasion, the tumor being 94% estrogen and 61% progesterone receptor positive, with an MIB-1 of 9% and no evidence of HER2 amplification; s/p radiation completed August 2012; opted to forgo adjuvant anti-estrogens  PLAN: Gusta is doing very well from a breast cancer point of view, and I am comfortable releasing her to the care of her primary care physician, Derwood Kaplan knee, which is what she would like. From a breast cancer point of view all she will need is a yearly mammogram and yearly physician breast exam  She has a mild macrocytosis, with a slight leukopenia. This could be an early indication of myelodysplasia. So long as it does not lead to significant cytopenias, followup only would be required.   MAGRINAT,GUSTAV C    04/16/2012

## 2012-04-18 ENCOUNTER — Telehealth: Payer: Self-pay | Admitting: Oncology

## 2012-04-18 NOTE — Telephone Encounter (Signed)
Letter sent to pt.and Dr.Mark Perini office

## 2012-05-03 DIAGNOSIS — H11129 Conjunctival concretions, unspecified eye: Secondary | ICD-10-CM | POA: Diagnosis not present

## 2012-05-03 DIAGNOSIS — H02059 Trichiasis without entropian unspecified eye, unspecified eyelid: Secondary | ICD-10-CM | POA: Diagnosis not present

## 2012-05-03 DIAGNOSIS — H04129 Dry eye syndrome of unspecified lacrimal gland: Secondary | ICD-10-CM | POA: Diagnosis not present

## 2012-05-03 DIAGNOSIS — H2510 Age-related nuclear cataract, unspecified eye: Secondary | ICD-10-CM | POA: Diagnosis not present

## 2012-05-11 DIAGNOSIS — H01009 Unspecified blepharitis unspecified eye, unspecified eyelid: Secondary | ICD-10-CM | POA: Diagnosis not present

## 2012-05-11 DIAGNOSIS — H0019 Chalazion unspecified eye, unspecified eyelid: Secondary | ICD-10-CM | POA: Diagnosis not present

## 2012-05-17 DIAGNOSIS — L439 Lichen planus, unspecified: Secondary | ICD-10-CM | POA: Diagnosis not present

## 2012-05-22 DIAGNOSIS — H2510 Age-related nuclear cataract, unspecified eye: Secondary | ICD-10-CM | POA: Diagnosis not present

## 2012-05-25 DIAGNOSIS — L259 Unspecified contact dermatitis, unspecified cause: Secondary | ICD-10-CM | POA: Diagnosis not present

## 2012-05-28 DIAGNOSIS — H2510 Age-related nuclear cataract, unspecified eye: Secondary | ICD-10-CM | POA: Diagnosis not present

## 2012-05-28 DIAGNOSIS — H25049 Posterior subcapsular polar age-related cataract, unspecified eye: Secondary | ICD-10-CM | POA: Diagnosis not present

## 2012-06-04 DIAGNOSIS — H25049 Posterior subcapsular polar age-related cataract, unspecified eye: Secondary | ICD-10-CM | POA: Diagnosis not present

## 2012-06-04 DIAGNOSIS — H2510 Age-related nuclear cataract, unspecified eye: Secondary | ICD-10-CM | POA: Diagnosis not present

## 2012-06-26 DIAGNOSIS — Z23 Encounter for immunization: Secondary | ICD-10-CM | POA: Diagnosis not present

## 2012-07-06 DIAGNOSIS — R300 Dysuria: Secondary | ICD-10-CM | POA: Diagnosis not present

## 2012-07-20 DIAGNOSIS — M949 Disorder of cartilage, unspecified: Secondary | ICD-10-CM | POA: Diagnosis not present

## 2012-07-20 DIAGNOSIS — R300 Dysuria: Secondary | ICD-10-CM | POA: Diagnosis not present

## 2012-07-20 DIAGNOSIS — I10 Essential (primary) hypertension: Secondary | ICD-10-CM | POA: Diagnosis not present

## 2012-07-20 DIAGNOSIS — D539 Nutritional anemia, unspecified: Secondary | ICD-10-CM | POA: Diagnosis not present

## 2012-07-20 DIAGNOSIS — R82998 Other abnormal findings in urine: Secondary | ICD-10-CM | POA: Diagnosis not present

## 2012-07-20 DIAGNOSIS — E039 Hypothyroidism, unspecified: Secondary | ICD-10-CM | POA: Diagnosis not present

## 2012-07-20 DIAGNOSIS — E785 Hyperlipidemia, unspecified: Secondary | ICD-10-CM | POA: Diagnosis not present

## 2012-07-27 DIAGNOSIS — Z1331 Encounter for screening for depression: Secondary | ICD-10-CM | POA: Diagnosis not present

## 2012-07-27 DIAGNOSIS — E039 Hypothyroidism, unspecified: Secondary | ICD-10-CM | POA: Diagnosis not present

## 2012-07-27 DIAGNOSIS — Z0000 Encounter for general adult medical examination without abnormal findings: Secondary | ICD-10-CM | POA: Diagnosis not present

## 2012-07-27 DIAGNOSIS — G459 Transient cerebral ischemic attack, unspecified: Secondary | ICD-10-CM | POA: Diagnosis not present

## 2012-07-27 DIAGNOSIS — Z124 Encounter for screening for malignant neoplasm of cervix: Secondary | ICD-10-CM | POA: Diagnosis not present

## 2012-07-27 DIAGNOSIS — I10 Essential (primary) hypertension: Secondary | ICD-10-CM | POA: Diagnosis not present

## 2012-08-08 DIAGNOSIS — Z23 Encounter for immunization: Secondary | ICD-10-CM | POA: Diagnosis not present

## 2012-10-18 DIAGNOSIS — L821 Other seborrheic keratosis: Secondary | ICD-10-CM | POA: Diagnosis not present

## 2012-10-18 DIAGNOSIS — L439 Lichen planus, unspecified: Secondary | ICD-10-CM | POA: Diagnosis not present

## 2012-10-18 DIAGNOSIS — L909 Atrophic disorder of skin, unspecified: Secondary | ICD-10-CM | POA: Diagnosis not present

## 2012-10-30 DIAGNOSIS — H00029 Hordeolum internum unspecified eye, unspecified eyelid: Secondary | ICD-10-CM | POA: Diagnosis not present

## 2012-11-06 DIAGNOSIS — H01009 Unspecified blepharitis unspecified eye, unspecified eyelid: Secondary | ICD-10-CM | POA: Diagnosis not present

## 2012-11-16 DIAGNOSIS — L439 Lichen planus, unspecified: Secondary | ICD-10-CM | POA: Diagnosis not present

## 2012-12-19 DIAGNOSIS — IMO0002 Reserved for concepts with insufficient information to code with codable children: Secondary | ICD-10-CM | POA: Diagnosis not present

## 2012-12-19 DIAGNOSIS — M5126 Other intervertebral disc displacement, lumbar region: Secondary | ICD-10-CM | POA: Diagnosis not present

## 2012-12-19 DIAGNOSIS — M999 Biomechanical lesion, unspecified: Secondary | ICD-10-CM | POA: Diagnosis not present

## 2012-12-19 DIAGNOSIS — M5137 Other intervertebral disc degeneration, lumbosacral region: Secondary | ICD-10-CM | POA: Diagnosis not present

## 2012-12-19 DIAGNOSIS — M25559 Pain in unspecified hip: Secondary | ICD-10-CM | POA: Diagnosis not present

## 2012-12-25 DIAGNOSIS — IMO0002 Reserved for concepts with insufficient information to code with codable children: Secondary | ICD-10-CM | POA: Diagnosis not present

## 2012-12-25 DIAGNOSIS — M25559 Pain in unspecified hip: Secondary | ICD-10-CM | POA: Diagnosis not present

## 2012-12-25 DIAGNOSIS — M999 Biomechanical lesion, unspecified: Secondary | ICD-10-CM | POA: Diagnosis not present

## 2012-12-25 DIAGNOSIS — M5137 Other intervertebral disc degeneration, lumbosacral region: Secondary | ICD-10-CM | POA: Diagnosis not present

## 2012-12-25 DIAGNOSIS — M5126 Other intervertebral disc displacement, lumbar region: Secondary | ICD-10-CM | POA: Diagnosis not present

## 2012-12-26 DIAGNOSIS — M999 Biomechanical lesion, unspecified: Secondary | ICD-10-CM | POA: Diagnosis not present

## 2012-12-26 DIAGNOSIS — M5137 Other intervertebral disc degeneration, lumbosacral region: Secondary | ICD-10-CM | POA: Diagnosis not present

## 2012-12-26 DIAGNOSIS — IMO0002 Reserved for concepts with insufficient information to code with codable children: Secondary | ICD-10-CM | POA: Diagnosis not present

## 2012-12-26 DIAGNOSIS — M5126 Other intervertebral disc displacement, lumbar region: Secondary | ICD-10-CM | POA: Diagnosis not present

## 2012-12-26 DIAGNOSIS — M25559 Pain in unspecified hip: Secondary | ICD-10-CM | POA: Diagnosis not present

## 2012-12-28 DIAGNOSIS — M5126 Other intervertebral disc displacement, lumbar region: Secondary | ICD-10-CM | POA: Diagnosis not present

## 2012-12-28 DIAGNOSIS — M5137 Other intervertebral disc degeneration, lumbosacral region: Secondary | ICD-10-CM | POA: Diagnosis not present

## 2012-12-28 DIAGNOSIS — M999 Biomechanical lesion, unspecified: Secondary | ICD-10-CM | POA: Diagnosis not present

## 2012-12-28 DIAGNOSIS — M25559 Pain in unspecified hip: Secondary | ICD-10-CM | POA: Diagnosis not present

## 2012-12-28 DIAGNOSIS — IMO0002 Reserved for concepts with insufficient information to code with codable children: Secondary | ICD-10-CM | POA: Diagnosis not present

## 2012-12-31 DIAGNOSIS — M545 Low back pain: Secondary | ICD-10-CM | POA: Diagnosis not present

## 2012-12-31 DIAGNOSIS — T148XXA Other injury of unspecified body region, initial encounter: Secondary | ICD-10-CM | POA: Diagnosis not present

## 2012-12-31 DIAGNOSIS — IMO0002 Reserved for concepts with insufficient information to code with codable children: Secondary | ICD-10-CM | POA: Diagnosis not present

## 2013-01-02 DIAGNOSIS — M999 Biomechanical lesion, unspecified: Secondary | ICD-10-CM | POA: Diagnosis not present

## 2013-01-02 DIAGNOSIS — IMO0002 Reserved for concepts with insufficient information to code with codable children: Secondary | ICD-10-CM | POA: Diagnosis not present

## 2013-01-02 DIAGNOSIS — M5137 Other intervertebral disc degeneration, lumbosacral region: Secondary | ICD-10-CM | POA: Diagnosis not present

## 2013-01-02 DIAGNOSIS — M5126 Other intervertebral disc displacement, lumbar region: Secondary | ICD-10-CM | POA: Diagnosis not present

## 2013-01-02 DIAGNOSIS — M25559 Pain in unspecified hip: Secondary | ICD-10-CM | POA: Diagnosis not present

## 2013-01-03 DIAGNOSIS — IMO0002 Reserved for concepts with insufficient information to code with codable children: Secondary | ICD-10-CM | POA: Diagnosis not present

## 2013-01-03 DIAGNOSIS — L821 Other seborrheic keratosis: Secondary | ICD-10-CM | POA: Diagnosis not present

## 2013-01-03 DIAGNOSIS — L439 Lichen planus, unspecified: Secondary | ICD-10-CM | POA: Diagnosis not present

## 2013-01-03 DIAGNOSIS — M25559 Pain in unspecified hip: Secondary | ICD-10-CM | POA: Diagnosis not present

## 2013-01-03 DIAGNOSIS — M999 Biomechanical lesion, unspecified: Secondary | ICD-10-CM | POA: Diagnosis not present

## 2013-01-03 DIAGNOSIS — M5137 Other intervertebral disc degeneration, lumbosacral region: Secondary | ICD-10-CM | POA: Diagnosis not present

## 2013-01-03 DIAGNOSIS — M5126 Other intervertebral disc displacement, lumbar region: Secondary | ICD-10-CM | POA: Diagnosis not present

## 2013-01-07 DIAGNOSIS — IMO0002 Reserved for concepts with insufficient information to code with codable children: Secondary | ICD-10-CM | POA: Diagnosis not present

## 2013-01-07 DIAGNOSIS — M5137 Other intervertebral disc degeneration, lumbosacral region: Secondary | ICD-10-CM | POA: Diagnosis not present

## 2013-01-07 DIAGNOSIS — M999 Biomechanical lesion, unspecified: Secondary | ICD-10-CM | POA: Diagnosis not present

## 2013-01-07 DIAGNOSIS — M5126 Other intervertebral disc displacement, lumbar region: Secondary | ICD-10-CM | POA: Diagnosis not present

## 2013-01-07 DIAGNOSIS — M25559 Pain in unspecified hip: Secondary | ICD-10-CM | POA: Diagnosis not present

## 2013-01-09 DIAGNOSIS — M25559 Pain in unspecified hip: Secondary | ICD-10-CM | POA: Diagnosis not present

## 2013-01-09 DIAGNOSIS — IMO0002 Reserved for concepts with insufficient information to code with codable children: Secondary | ICD-10-CM | POA: Diagnosis not present

## 2013-01-09 DIAGNOSIS — M5126 Other intervertebral disc displacement, lumbar region: Secondary | ICD-10-CM | POA: Diagnosis not present

## 2013-01-09 DIAGNOSIS — M5137 Other intervertebral disc degeneration, lumbosacral region: Secondary | ICD-10-CM | POA: Diagnosis not present

## 2013-01-09 DIAGNOSIS — M999 Biomechanical lesion, unspecified: Secondary | ICD-10-CM | POA: Diagnosis not present

## 2013-01-10 DIAGNOSIS — M5126 Other intervertebral disc displacement, lumbar region: Secondary | ICD-10-CM | POA: Diagnosis not present

## 2013-01-10 DIAGNOSIS — M999 Biomechanical lesion, unspecified: Secondary | ICD-10-CM | POA: Diagnosis not present

## 2013-01-10 DIAGNOSIS — IMO0002 Reserved for concepts with insufficient information to code with codable children: Secondary | ICD-10-CM | POA: Diagnosis not present

## 2013-01-10 DIAGNOSIS — M5137 Other intervertebral disc degeneration, lumbosacral region: Secondary | ICD-10-CM | POA: Diagnosis not present

## 2013-01-10 DIAGNOSIS — M25559 Pain in unspecified hip: Secondary | ICD-10-CM | POA: Diagnosis not present

## 2013-01-14 DIAGNOSIS — M999 Biomechanical lesion, unspecified: Secondary | ICD-10-CM | POA: Diagnosis not present

## 2013-01-14 DIAGNOSIS — IMO0002 Reserved for concepts with insufficient information to code with codable children: Secondary | ICD-10-CM | POA: Diagnosis not present

## 2013-01-14 DIAGNOSIS — M25559 Pain in unspecified hip: Secondary | ICD-10-CM | POA: Diagnosis not present

## 2013-01-14 DIAGNOSIS — M5137 Other intervertebral disc degeneration, lumbosacral region: Secondary | ICD-10-CM | POA: Diagnosis not present

## 2013-01-14 DIAGNOSIS — M5126 Other intervertebral disc displacement, lumbar region: Secondary | ICD-10-CM | POA: Diagnosis not present

## 2013-01-21 DIAGNOSIS — M5126 Other intervertebral disc displacement, lumbar region: Secondary | ICD-10-CM | POA: Diagnosis not present

## 2013-01-21 DIAGNOSIS — M999 Biomechanical lesion, unspecified: Secondary | ICD-10-CM | POA: Diagnosis not present

## 2013-01-21 DIAGNOSIS — IMO0002 Reserved for concepts with insufficient information to code with codable children: Secondary | ICD-10-CM | POA: Diagnosis not present

## 2013-01-21 DIAGNOSIS — M5137 Other intervertebral disc degeneration, lumbosacral region: Secondary | ICD-10-CM | POA: Diagnosis not present

## 2013-01-21 DIAGNOSIS — M25559 Pain in unspecified hip: Secondary | ICD-10-CM | POA: Diagnosis not present

## 2013-01-22 DIAGNOSIS — M899 Disorder of bone, unspecified: Secondary | ICD-10-CM | POA: Diagnosis not present

## 2013-01-22 DIAGNOSIS — M949 Disorder of cartilage, unspecified: Secondary | ICD-10-CM | POA: Diagnosis not present

## 2013-02-01 ENCOUNTER — Other Ambulatory Visit: Payer: Self-pay | Admitting: Internal Medicine

## 2013-02-01 DIAGNOSIS — Z853 Personal history of malignant neoplasm of breast: Secondary | ICD-10-CM

## 2013-02-01 DIAGNOSIS — Z9889 Other specified postprocedural states: Secondary | ICD-10-CM

## 2013-02-05 DIAGNOSIS — H01009 Unspecified blepharitis unspecified eye, unspecified eyelid: Secondary | ICD-10-CM | POA: Diagnosis not present

## 2013-03-06 ENCOUNTER — Ambulatory Visit
Admission: RE | Admit: 2013-03-06 | Discharge: 2013-03-06 | Disposition: A | Discharge status: Home / Self Care | Payer: Medicare Other | Source: Ambulatory Visit | Attending: Internal Medicine | Admitting: Internal Medicine

## 2013-03-06 DIAGNOSIS — Z853 Personal history of malignant neoplasm of breast: Secondary | ICD-10-CM | POA: Diagnosis not present

## 2013-03-06 DIAGNOSIS — Z9889 Other specified postprocedural states: Secondary | ICD-10-CM

## 2013-03-25 DIAGNOSIS — M25559 Pain in unspecified hip: Secondary | ICD-10-CM | POA: Diagnosis not present

## 2013-03-25 DIAGNOSIS — M5137 Other intervertebral disc degeneration, lumbosacral region: Secondary | ICD-10-CM | POA: Diagnosis not present

## 2013-03-25 DIAGNOSIS — M5126 Other intervertebral disc displacement, lumbar region: Secondary | ICD-10-CM | POA: Diagnosis not present

## 2013-03-25 DIAGNOSIS — M999 Biomechanical lesion, unspecified: Secondary | ICD-10-CM | POA: Diagnosis not present

## 2013-03-25 DIAGNOSIS — IMO0002 Reserved for concepts with insufficient information to code with codable children: Secondary | ICD-10-CM | POA: Diagnosis not present

## 2013-04-04 DIAGNOSIS — L723 Sebaceous cyst: Secondary | ICD-10-CM | POA: Diagnosis not present

## 2013-04-04 DIAGNOSIS — L439 Lichen planus, unspecified: Secondary | ICD-10-CM | POA: Diagnosis not present

## 2013-04-15 DIAGNOSIS — H6980 Other specified disorders of Eustachian tube, unspecified ear: Secondary | ICD-10-CM | POA: Diagnosis not present

## 2013-04-15 DIAGNOSIS — J310 Chronic rhinitis: Secondary | ICD-10-CM | POA: Diagnosis not present

## 2013-04-23 DIAGNOSIS — L819 Disorder of pigmentation, unspecified: Secondary | ICD-10-CM | POA: Diagnosis not present

## 2013-04-23 DIAGNOSIS — L719 Rosacea, unspecified: Secondary | ICD-10-CM | POA: Diagnosis not present

## 2013-04-23 DIAGNOSIS — D1801 Hemangioma of skin and subcutaneous tissue: Secondary | ICD-10-CM | POA: Diagnosis not present

## 2013-04-23 DIAGNOSIS — I8390 Asymptomatic varicose veins of unspecified lower extremity: Secondary | ICD-10-CM | POA: Diagnosis not present

## 2013-04-23 DIAGNOSIS — L439 Lichen planus, unspecified: Secondary | ICD-10-CM | POA: Diagnosis not present

## 2013-04-23 DIAGNOSIS — L821 Other seborrheic keratosis: Secondary | ICD-10-CM | POA: Diagnosis not present

## 2013-04-23 DIAGNOSIS — L909 Atrophic disorder of skin, unspecified: Secondary | ICD-10-CM | POA: Diagnosis not present

## 2013-06-12 DIAGNOSIS — L439 Lichen planus, unspecified: Secondary | ICD-10-CM | POA: Diagnosis not present

## 2013-06-12 DIAGNOSIS — L708 Other acne: Secondary | ICD-10-CM | POA: Diagnosis not present

## 2013-06-20 DIAGNOSIS — H0019 Chalazion unspecified eye, unspecified eyelid: Secondary | ICD-10-CM | POA: Diagnosis not present

## 2013-07-03 DIAGNOSIS — H0019 Chalazion unspecified eye, unspecified eyelid: Secondary | ICD-10-CM | POA: Diagnosis not present

## 2013-07-05 DIAGNOSIS — Z23 Encounter for immunization: Secondary | ICD-10-CM | POA: Diagnosis not present

## 2013-08-07 DIAGNOSIS — H02059 Trichiasis without entropian unspecified eye, unspecified eyelid: Secondary | ICD-10-CM | POA: Diagnosis not present

## 2013-08-09 DIAGNOSIS — E785 Hyperlipidemia, unspecified: Secondary | ICD-10-CM | POA: Diagnosis not present

## 2013-08-09 DIAGNOSIS — M899 Disorder of bone, unspecified: Secondary | ICD-10-CM | POA: Diagnosis not present

## 2013-08-09 DIAGNOSIS — D539 Nutritional anemia, unspecified: Secondary | ICD-10-CM | POA: Diagnosis not present

## 2013-08-09 DIAGNOSIS — E039 Hypothyroidism, unspecified: Secondary | ICD-10-CM | POA: Diagnosis not present

## 2013-08-13 DIAGNOSIS — I10 Essential (primary) hypertension: Secondary | ICD-10-CM | POA: Diagnosis not present

## 2013-08-23 DIAGNOSIS — M899 Disorder of bone, unspecified: Secondary | ICD-10-CM | POA: Diagnosis not present

## 2013-08-23 DIAGNOSIS — I10 Essential (primary) hypertension: Secondary | ICD-10-CM | POA: Diagnosis not present

## 2013-08-23 DIAGNOSIS — G4700 Insomnia, unspecified: Secondary | ICD-10-CM | POA: Diagnosis not present

## 2013-08-23 DIAGNOSIS — Z0000 Encounter for general adult medical examination without abnormal findings: Secondary | ICD-10-CM | POA: Diagnosis not present

## 2013-08-23 DIAGNOSIS — Z23 Encounter for immunization: Secondary | ICD-10-CM | POA: Diagnosis not present

## 2013-08-23 DIAGNOSIS — E039 Hypothyroidism, unspecified: Secondary | ICD-10-CM | POA: Diagnosis not present

## 2013-08-23 DIAGNOSIS — Z1331 Encounter for screening for depression: Secondary | ICD-10-CM | POA: Diagnosis not present

## 2013-08-23 DIAGNOSIS — E785 Hyperlipidemia, unspecified: Secondary | ICD-10-CM | POA: Diagnosis not present

## 2013-08-23 DIAGNOSIS — M545 Low back pain: Secondary | ICD-10-CM | POA: Diagnosis not present

## 2013-08-23 DIAGNOSIS — Z124 Encounter for screening for malignant neoplasm of cervix: Secondary | ICD-10-CM | POA: Diagnosis not present

## 2013-08-23 DIAGNOSIS — G459 Transient cerebral ischemic attack, unspecified: Secondary | ICD-10-CM | POA: Diagnosis not present

## 2013-08-26 ENCOUNTER — Ambulatory Visit
Admission: RE | Admit: 2013-08-26 | Discharge: 2013-08-26 | Disposition: A | Discharge status: Home / Self Care | Payer: PRIVATE HEALTH INSURANCE | Source: Ambulatory Visit | Attending: Internal Medicine | Admitting: Internal Medicine

## 2013-08-26 ENCOUNTER — Other Ambulatory Visit: Payer: Self-pay | Admitting: Internal Medicine

## 2013-08-26 DIAGNOSIS — M545 Low back pain: Secondary | ICD-10-CM

## 2013-08-26 DIAGNOSIS — M4120 Other idiopathic scoliosis, site unspecified: Secondary | ICD-10-CM | POA: Diagnosis not present

## 2013-10-09 DIAGNOSIS — I789 Disease of capillaries, unspecified: Secondary | ICD-10-CM | POA: Diagnosis not present

## 2013-10-09 DIAGNOSIS — L439 Lichen planus, unspecified: Secondary | ICD-10-CM | POA: Diagnosis not present

## 2013-10-16 DIAGNOSIS — H9209 Otalgia, unspecified ear: Secondary | ICD-10-CM | POA: Diagnosis not present

## 2013-10-16 DIAGNOSIS — IMO0002 Reserved for concepts with insufficient information to code with codable children: Secondary | ICD-10-CM | POA: Diagnosis not present

## 2013-11-21 DIAGNOSIS — L439 Lichen planus, unspecified: Secondary | ICD-10-CM | POA: Diagnosis not present

## 2013-11-21 DIAGNOSIS — L719 Rosacea, unspecified: Secondary | ICD-10-CM | POA: Diagnosis not present

## 2014-01-15 DIAGNOSIS — L439 Lichen planus, unspecified: Secondary | ICD-10-CM | POA: Diagnosis not present

## 2014-02-05 ENCOUNTER — Other Ambulatory Visit: Payer: Self-pay | Admitting: Internal Medicine

## 2014-02-05 DIAGNOSIS — Z853 Personal history of malignant neoplasm of breast: Secondary | ICD-10-CM

## 2014-02-05 DIAGNOSIS — Z9889 Other specified postprocedural states: Secondary | ICD-10-CM

## 2014-02-11 DIAGNOSIS — S335XXA Sprain of ligaments of lumbar spine, initial encounter: Secondary | ICD-10-CM | POA: Diagnosis not present

## 2014-02-12 DIAGNOSIS — S335XXA Sprain of ligaments of lumbar spine, initial encounter: Secondary | ICD-10-CM | POA: Diagnosis not present

## 2014-02-12 DIAGNOSIS — L439 Lichen planus, unspecified: Secondary | ICD-10-CM | POA: Diagnosis not present

## 2014-03-10 ENCOUNTER — Ambulatory Visit
Admission: RE | Admit: 2014-03-10 | Discharge: 2014-03-10 | Disposition: A | Discharge status: Home / Self Care | Payer: Medicare Other | Source: Ambulatory Visit | Attending: Internal Medicine | Admitting: Internal Medicine

## 2014-03-10 DIAGNOSIS — Z853 Personal history of malignant neoplasm of breast: Secondary | ICD-10-CM

## 2014-03-10 DIAGNOSIS — R928 Other abnormal and inconclusive findings on diagnostic imaging of breast: Secondary | ICD-10-CM | POA: Diagnosis not present

## 2014-03-10 DIAGNOSIS — Z9889 Other specified postprocedural states: Secondary | ICD-10-CM

## 2014-04-16 DIAGNOSIS — L439 Lichen planus, unspecified: Secondary | ICD-10-CM | POA: Diagnosis not present

## 2014-05-21 DIAGNOSIS — H00039 Abscess of eyelid unspecified eye, unspecified eyelid: Secondary | ICD-10-CM | POA: Diagnosis not present

## 2014-06-03 DIAGNOSIS — H00039 Abscess of eyelid unspecified eye, unspecified eyelid: Secondary | ICD-10-CM | POA: Diagnosis not present

## 2014-06-10 DIAGNOSIS — H0019 Chalazion unspecified eye, unspecified eyelid: Secondary | ICD-10-CM | POA: Diagnosis not present

## 2014-06-11 DIAGNOSIS — L439 Lichen planus, unspecified: Secondary | ICD-10-CM | POA: Diagnosis not present

## 2014-06-11 DIAGNOSIS — D692 Other nonthrombocytopenic purpura: Secondary | ICD-10-CM | POA: Diagnosis not present

## 2014-06-11 DIAGNOSIS — L819 Disorder of pigmentation, unspecified: Secondary | ICD-10-CM | POA: Diagnosis not present

## 2014-06-11 DIAGNOSIS — D235 Other benign neoplasm of skin of trunk: Secondary | ICD-10-CM | POA: Diagnosis not present

## 2014-06-11 DIAGNOSIS — L821 Other seborrheic keratosis: Secondary | ICD-10-CM | POA: Diagnosis not present

## 2014-06-11 DIAGNOSIS — D1801 Hemangioma of skin and subcutaneous tissue: Secondary | ICD-10-CM | POA: Diagnosis not present

## 2014-07-04 DIAGNOSIS — Z23 Encounter for immunization: Secondary | ICD-10-CM | POA: Diagnosis not present

## 2014-08-06 DIAGNOSIS — H0015 Chalazion left lower eyelid: Secondary | ICD-10-CM | POA: Diagnosis not present

## 2014-08-22 DIAGNOSIS — Z0000 Encounter for general adult medical examination without abnormal findings: Secondary | ICD-10-CM | POA: Diagnosis not present

## 2014-08-22 DIAGNOSIS — Z008 Encounter for other general examination: Secondary | ICD-10-CM | POA: Diagnosis not present

## 2014-08-22 DIAGNOSIS — D539 Nutritional anemia, unspecified: Secondary | ICD-10-CM | POA: Diagnosis not present

## 2014-08-22 DIAGNOSIS — R7301 Impaired fasting glucose: Secondary | ICD-10-CM | POA: Diagnosis not present

## 2014-08-22 DIAGNOSIS — E039 Hypothyroidism, unspecified: Secondary | ICD-10-CM | POA: Diagnosis not present

## 2014-08-22 DIAGNOSIS — E785 Hyperlipidemia, unspecified: Secondary | ICD-10-CM | POA: Diagnosis not present

## 2014-08-22 DIAGNOSIS — M859 Disorder of bone density and structure, unspecified: Secondary | ICD-10-CM | POA: Diagnosis not present

## 2014-08-22 DIAGNOSIS — I10 Essential (primary) hypertension: Secondary | ICD-10-CM | POA: Diagnosis not present

## 2014-08-29 DIAGNOSIS — E039 Hypothyroidism, unspecified: Secondary | ICD-10-CM | POA: Diagnosis not present

## 2014-08-29 DIAGNOSIS — G4700 Insomnia, unspecified: Secondary | ICD-10-CM | POA: Diagnosis not present

## 2014-08-29 DIAGNOSIS — R7301 Impaired fasting glucose: Secondary | ICD-10-CM | POA: Diagnosis not present

## 2014-08-29 DIAGNOSIS — Z0000 Encounter for general adult medical examination without abnormal findings: Secondary | ICD-10-CM | POA: Diagnosis not present

## 2014-08-29 DIAGNOSIS — G459 Transient cerebral ischemic attack, unspecified: Secondary | ICD-10-CM | POA: Diagnosis not present

## 2014-08-29 DIAGNOSIS — I10 Essential (primary) hypertension: Secondary | ICD-10-CM | POA: Diagnosis not present

## 2014-08-29 DIAGNOSIS — C50919 Malignant neoplasm of unspecified site of unspecified female breast: Secondary | ICD-10-CM | POA: Diagnosis not present

## 2014-08-29 DIAGNOSIS — N390 Urinary tract infection, site not specified: Secondary | ICD-10-CM | POA: Diagnosis not present

## 2014-08-29 DIAGNOSIS — M545 Low back pain: Secondary | ICD-10-CM | POA: Diagnosis not present

## 2014-08-29 DIAGNOSIS — E785 Hyperlipidemia, unspecified: Secondary | ICD-10-CM | POA: Diagnosis not present

## 2014-08-29 DIAGNOSIS — Z1389 Encounter for screening for other disorder: Secondary | ICD-10-CM | POA: Diagnosis not present

## 2014-08-30 IMAGING — CR DG LUMBAR SPINE COMPLETE 4+V
5 series · 5 of 5 positions shown · non-contrast
Comparison: None.

CLINICAL DATA: Low back pain

EXAM:
LUMBAR SPINE - COMPLETE 4+ VIEW

[view not recorded (1 of 5)]
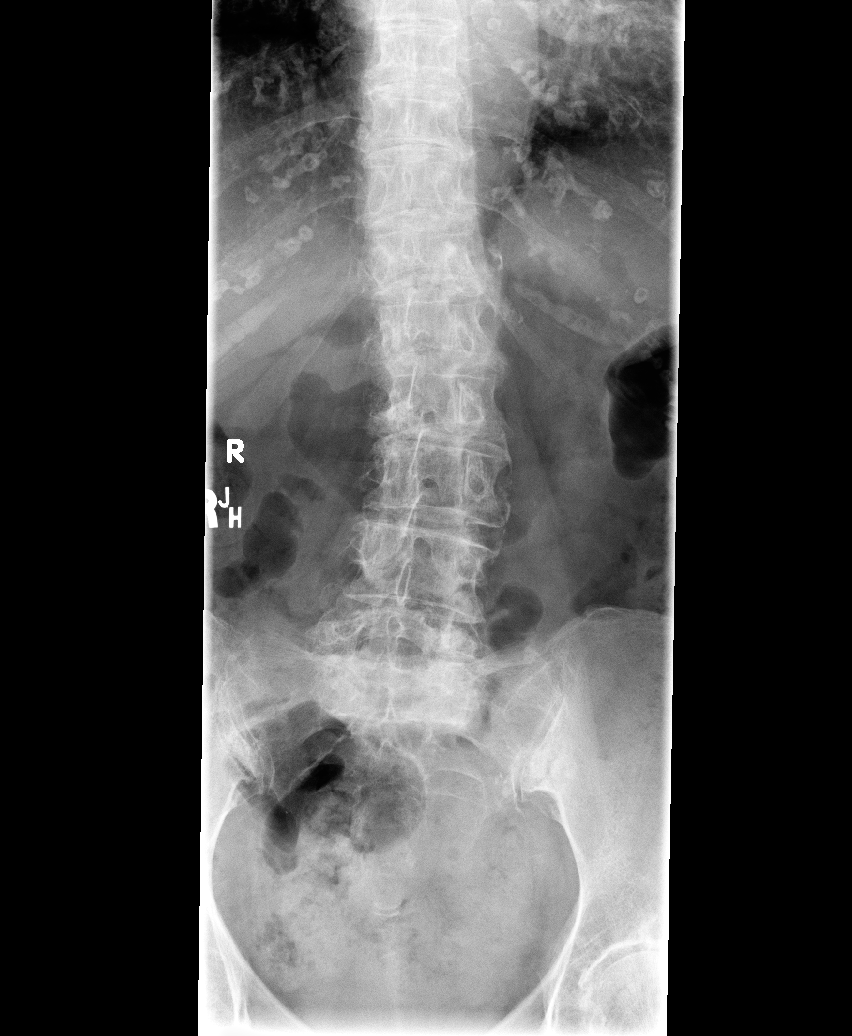

[view not recorded (2 of 5)]
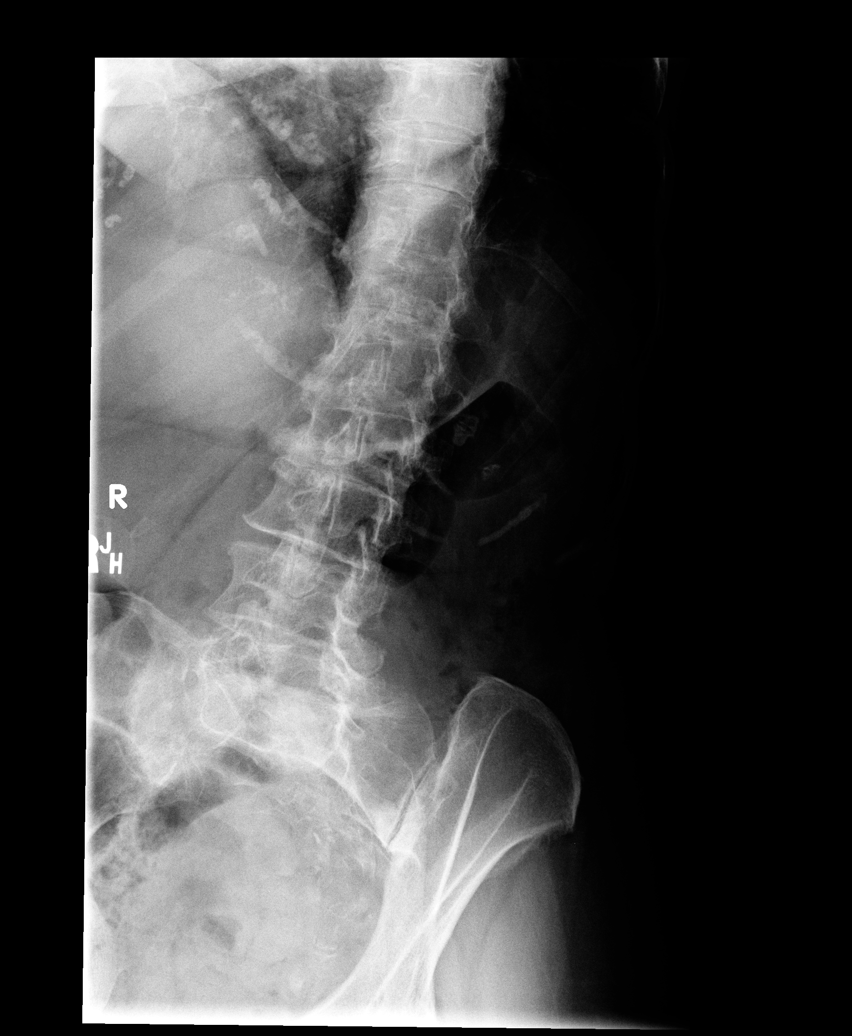

[view not recorded (3 of 5)]
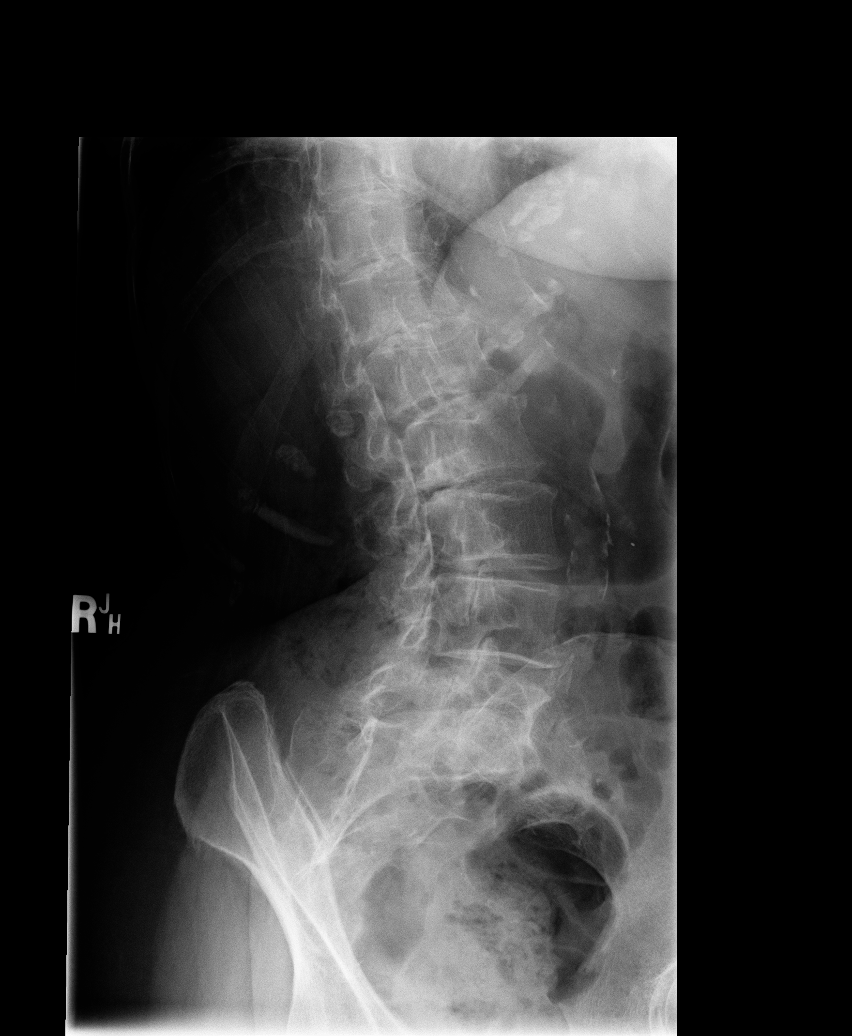

[view not recorded (4 of 5)]
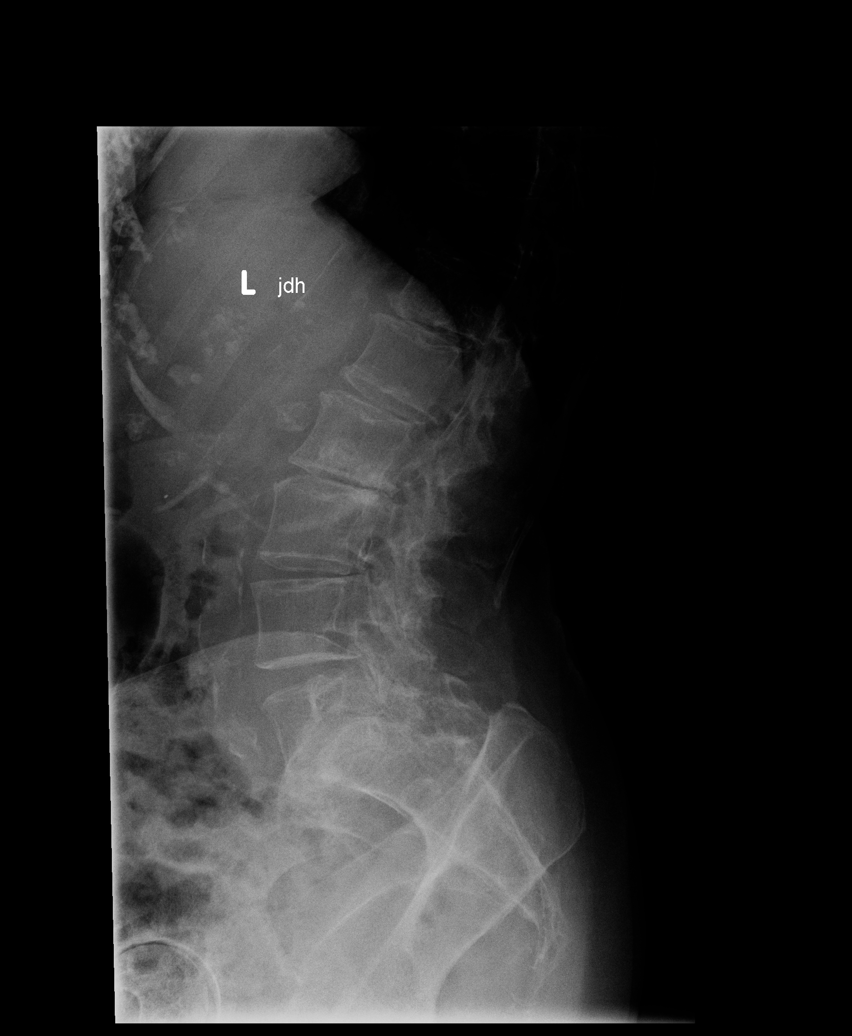

[view not recorded (5 of 5)]
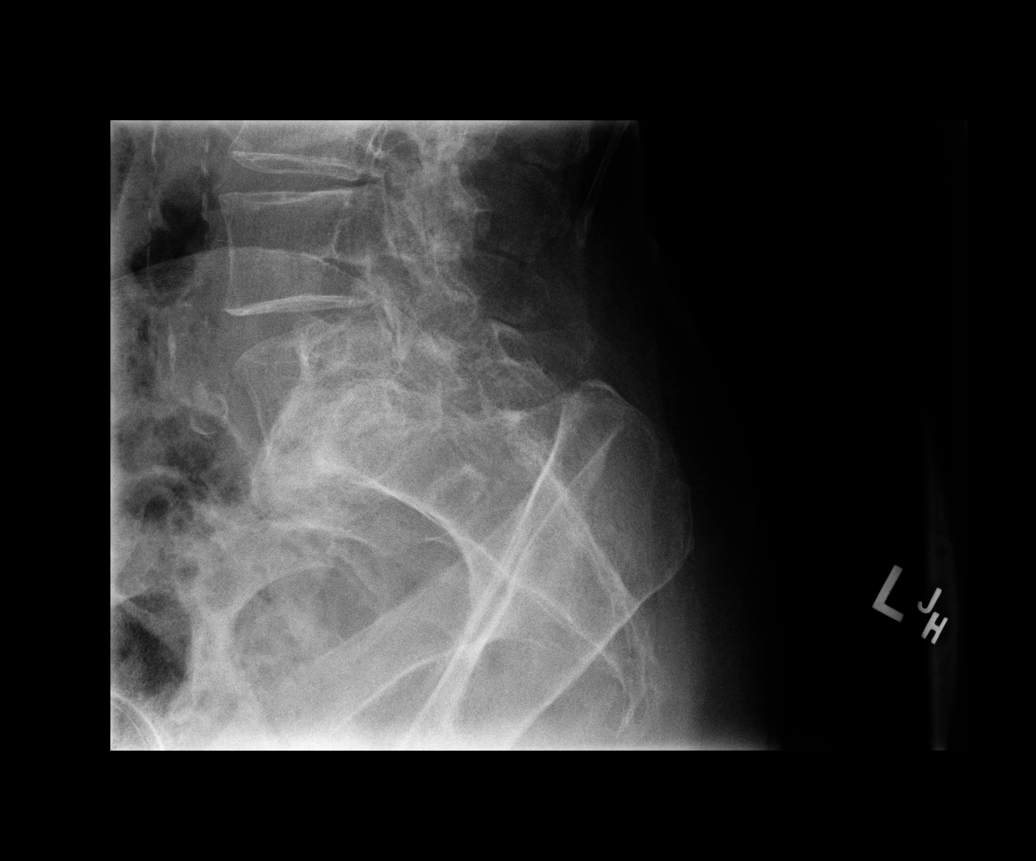

[5 of 5 positions shown; findings below may reference images not displayed]

FINDINGS: Mild levoscoliosis at L2-3. Grade 1 anterior slip L5-S1, not well
evaluated due to the scoliosis.

Disc space narrowing on the right at L2-3 and L3-4 related to
scoliosis. Negative for fracture. No pars defect.
IMPRESSION: Mild levoscoliosis

Grade 1 slip L5-S1.

No acute bony abnormality.

## 2014-09-01 ENCOUNTER — Other Ambulatory Visit: Payer: Self-pay | Admitting: Internal Medicine

## 2014-09-01 ENCOUNTER — Ambulatory Visit
Admission: RE | Admit: 2014-09-01 | Discharge: 2014-09-01 | Disposition: A | Discharge status: Home / Self Care | Payer: Medicare Other | Source: Ambulatory Visit | Attending: Internal Medicine | Admitting: Internal Medicine

## 2014-09-01 DIAGNOSIS — M5032 Other cervical disc degeneration, mid-cervical region: Secondary | ICD-10-CM | POA: Diagnosis not present

## 2014-09-01 DIAGNOSIS — M542 Cervicalgia: Secondary | ICD-10-CM

## 2014-09-01 DIAGNOSIS — M5031 Other cervical disc degeneration,  high cervical region: Secondary | ICD-10-CM | POA: Diagnosis not present

## 2014-09-01 DIAGNOSIS — M4312 Spondylolisthesis, cervical region: Secondary | ICD-10-CM | POA: Diagnosis not present

## 2014-09-01 DIAGNOSIS — M47812 Spondylosis without myelopathy or radiculopathy, cervical region: Secondary | ICD-10-CM | POA: Diagnosis not present

## 2014-09-04 DIAGNOSIS — L661 Lichen planopilaris: Secondary | ICD-10-CM | POA: Diagnosis not present

## 2014-09-04 DIAGNOSIS — L2089 Other atopic dermatitis: Secondary | ICD-10-CM | POA: Diagnosis not present

## 2014-09-04 DIAGNOSIS — L309 Dermatitis, unspecified: Secondary | ICD-10-CM | POA: Diagnosis not present

## 2014-10-16 DIAGNOSIS — L433 Subacute (active) lichen planus: Secondary | ICD-10-CM | POA: Diagnosis not present

## 2014-11-04 ENCOUNTER — Ambulatory Visit (INDEPENDENT_AMBULATORY_CARE_PROVIDER_SITE_OTHER): Payer: Medicare Other | Admitting: Sports Medicine

## 2014-11-04 ENCOUNTER — Encounter: Payer: Self-pay | Admitting: Sports Medicine

## 2014-11-04 VITALS — BP 177/74 | Ht 62.0 in | Wt 122.0 lb

## 2014-11-04 DIAGNOSIS — M545 Low back pain, unspecified: Secondary | ICD-10-CM | POA: Insufficient documentation

## 2014-11-04 DIAGNOSIS — M542 Cervicalgia: Secondary | ICD-10-CM | POA: Diagnosis not present

## 2014-11-04 NOTE — Progress Notes (Signed)
  AIDEL DAVISSON - 81 y.o. female MRN [id number]  Date of birth: [birth year]  SUBJECTIVE:  Including CC & ROS.  This Chales Abrahams is an 81 year old female presenting today for chronic low back pain for one year. Patient describes the pain as a dull ache and stiffness. The pain does not radiate down her leg, mild radiation to the left lateral buttock. Patient denies any numbness or tingling into the lower leg. Denies any groin pain. Denies any bladder or bowel dysfunction. Describes general weakness with walking. She's been treating her low back pain with Biofreeze which provides her some relief as well as Tylenol. She attempted physical therapy in the past but only 1-2 sessions she felt she was not getting the right attention. She also mentioned a recent episode of right-sided upper back pain that was mostly muscular and finally resolved on its own. She reports know both cervical and lumbar degenerative spine disease.    ROS: Review of systems otherwise negative except for information present in HPI  HISTORY: Past Medical, Surgical, Social, and Family History Reviewed & Updated per EMR. Pertinent Historical Findings include: Past medical history of TIA currently on daily 325 mg aspirin, hypertension on amlodipine and Benicar, hypothyroidism currently on levothyroxine, and hyperlipidemia on simvastatin  DATA REVIEWED: -Lumbar MRI from 2014 revealed disc space narrowing at L2-L3 and L4-L5, scoliosis, as well as mild levoscoliosis and grade 1 spondylolisthesis of L5 slipping on S1 -Cervical x-rays from 2015 revealed extensive osteoarthritic changes with mild spondylolisthesis at L4-L5, degenerative disc disease. And scoliosis changes.  PHYSICAL EXAM:  VS: BP:(!) 177/74 mmHg  HR: bpm  TEMP: ( )  RESP:   HT:5\' 2"  (157.5 cm)   WT:122 lb (55.339 kg)  BMI:22.4 LOW BACK EXAM: General: well nourished Skin of LE: warm; dry, no rashes, lesions, ecchymosis or erythema. Vascular: radial pulses 2+  bilaterally Neurologically: Sensation to light touch lower extremities equal and intact bilaterally.  Observation: Normal curvature and no kyphosis or lordosis, no scoliosis.  Iliac crests are symmetric, shoulders line symmetrically Palpation:  No step off defects noted in the thoracic or lumbar spine.   No muscle spasm, some TTP along the paraspinal musculature of the lumbar spine Left>Right. Range of motion:  decrease flexion on forward bending, no pain with extension, no pain with one leg hyperextension. Neuromuscular: No pain with straight leg raise left or right Nerve root intervention:   L2 and L3: Normal hip flexion with global bilateral weakness. L2, L3, L4: Normal hip abduction bilaterally.   L4, L5, S1: Normal hip abduction bilaterally S1 and S2: Normal ankle plantar-flexion bilaterally L5: Normal extensor hallucis longus bilaterally  ASSESSMENT & PLAN: See problem based charting & AVS for pt instructions.

## 2014-11-04 NOTE — Assessment & Plan Note (Addendum)
Patient presented today with chronic low back pain without sciatica. Physical exam revealed global muscle weakness in bilateral lower extremity, as well as scoliosis changes in her back that are probably affecting her neck and lumbar spine. As well as poor flexibility.  Recommendations: -Offered patient that her pain control with tramadol that she refused. -Provided patient with chair stretching exercises to work on her flexibility. Encouraged her to start walking again for additional mobility.  -Provided patient with a neck collar to provide some rest of the musculature of her paraspinal cervical and upper traps -Will follow-up in 4-6 weeks

## 2014-11-27 DIAGNOSIS — L433 Subacute (active) lichen planus: Secondary | ICD-10-CM | POA: Diagnosis not present

## 2014-12-02 ENCOUNTER — Encounter: Payer: Self-pay | Admitting: Sports Medicine

## 2014-12-02 ENCOUNTER — Ambulatory Visit (INDEPENDENT_AMBULATORY_CARE_PROVIDER_SITE_OTHER): Payer: Medicare Other | Admitting: Sports Medicine

## 2014-12-02 VITALS — BP 170/52 | Ht 62.0 in | Wt 122.0 lb

## 2014-12-02 DIAGNOSIS — R269 Unspecified abnormalities of gait and mobility: Secondary | ICD-10-CM | POA: Diagnosis not present

## 2014-12-02 DIAGNOSIS — M545 Low back pain, unspecified: Secondary | ICD-10-CM

## 2014-12-02 DIAGNOSIS — M542 Cervicalgia: Secondary | ICD-10-CM | POA: Diagnosis not present

## 2014-12-02 NOTE — Patient Instructions (Signed)
Try doing 3 or 4 short walks each day  Huse walking stick  Try to do easy back motion - not as aggressive of bending  Use your collar for 1 hour each evening to help hold neck up straight  Go to PT for at least one evaluation and tell them about the challenge of getting up after getting down to do the beds.

## 2014-12-02 NOTE — Progress Notes (Signed)
Patient ID: Tanya Morales, female   DOB: 1929-09-14, 81 y.o.   MRN: [id number]  The patient comes back because of her chronic neck and low back pain She lives alone Up until 2 years ago when she had her stroke she would try to do some regular exercise even walking on her treadmill Since that time she is unable to do regular exercise and feels somewhat unstable when walking  She lives alone at [place] in a condo She says that when she makes her bed she gets down on her knees but cannot get back up and has to walk on her knees over to where she can pull herself up   She does not like to take medications but occasionally will use Tylenol  I gave her some very easy exercises to try primarily based on sitting in a chair and doing forward flexion This was to lessen the stress on her lumbar spondylolisthesis these were too much for her and gave her back pain  She does use a cervical collar for 1 hour at night She cannot tell much difference with this  In the past she has done some physical therapy but felt it was too aggressive and only went for a couple of sessions   Physical examination Elderly lady who is in no acute distress BP 170/52 mmHg  Ht 5\' 2"  (1.575 m)  Wt 122 lb (55.339 kg)  BMI 22.31 kg/m2  Limitation of motion of her cervical spine Slight head forward position No pain with motion  Lumbar spine shows some kyphoscoliosis Easy forward flexion and extension causes no pain in a limited range of motion Lateral side to side bend is not painful  Walking gait reveals that she actually was pretty steady and is able to do a 2 point turn  She feels weak on doing so but can get out of a chair with no assistance

## 2014-12-02 NOTE — Assessment & Plan Note (Signed)
There is a lot of degenerative change and a grade 1 spondylolisthesis  I suggested easy side to side and flexion and extension motions Walking just a short distance 3-4 times a day  Go for an evaluation of gait training and safety by physical therapy  She wants to travel to [place] in June but needs to improve her strength and ability to ambulate prior to that time  I did suggest using a walking stick

## 2014-12-03 DIAGNOSIS — H00034 Abscess of left upper eyelid: Secondary | ICD-10-CM | POA: Diagnosis not present

## 2015-01-07 ENCOUNTER — Ambulatory Visit: Payer: Medicare Other | Attending: Sports Medicine

## 2015-01-07 DIAGNOSIS — M1990 Unspecified osteoarthritis, unspecified site: Secondary | ICD-10-CM | POA: Diagnosis not present

## 2015-01-07 DIAGNOSIS — E785 Hyperlipidemia, unspecified: Secondary | ICD-10-CM | POA: Diagnosis not present

## 2015-01-07 DIAGNOSIS — I10 Essential (primary) hypertension: Secondary | ICD-10-CM | POA: Diagnosis not present

## 2015-01-07 DIAGNOSIS — Z8673 Personal history of transient ischemic attack (TIA), and cerebral infarction without residual deficits: Secondary | ICD-10-CM | POA: Diagnosis not present

## 2015-01-07 DIAGNOSIS — C50919 Malignant neoplasm of unspecified site of unspecified female breast: Secondary | ICD-10-CM | POA: Diagnosis not present

## 2015-01-07 DIAGNOSIS — R269 Unspecified abnormalities of gait and mobility: Secondary | ICD-10-CM | POA: Insufficient documentation

## 2015-01-07 DIAGNOSIS — E039 Hypothyroidism, unspecified: Secondary | ICD-10-CM | POA: Insufficient documentation

## 2015-01-07 NOTE — Therapy (Signed)
[facility] [address] [place], [place], [address] Phone: [phone]   Fax:  [phone]  Physical Therapy Evaluation  Patient Details  Name: Tanya Morales MRN: [id number] Date of Birth: [birth year] Referring Provider:  Stefanie Libel, MD  Encounter Date: 01/07/2015      PT End of Session - 01/07/15 1704    Visit Number 1   Number of Visits 9   Date for PT Re-Evaluation 02/06/15   Authorization Type G-code every 10th visit.   PT Start Time 1407   PT Stop Time 1452   PT Time Calculation (min) 45 min   Activity Tolerance Patient tolerated treatment well   Behavior During Therapy WFL for tasks assessed/performed      Past Medical History  Diagnosis Date  . Hypothyroidism   . Hypertension   . Arthritis   . Breast cancer, stage 1 03/14/2011    left  . Hyperlipidemia   . TIA (transient ischemic attack) march 2012    Past Surgical History  Procedure Laterality Date  . Total abdominal hysterectomy    . Finger surgery    . Breast surgery  02/23/11    left lumpectomy  . Tonsillectomy and adenoidectomy      There were no vitals filed for this visit.  Visit Diagnosis:  Abnormality of gait - Plan: PT plan of care cert/re-cert      Subjective Assessment - 01/07/15 1422    Subjective L LE pain, impaired balance during ambulation , R sided back pain   Pertinent History TIA, HTN, Breast CA, chronic LBP   Patient Stated Goals travel to [place] in June, decrease pain, walk faster   Currently in Pain? No/denies  R thoracic pain increases to 7/10, L LE pain 6/10            [facility] PT Assessment - 01/07/15 1427    Assessment   Medical Diagnosis Gait difficulty   Onset Date 12/18/12   Prior Therapy PT for leg and back pain   Precautions   Precautions Fall   Restrictions   Weight Bearing Restrictions No   Balance Screen   Has the patient fallen in the past 6 months No   Has the patient had a decrease in  activity level because of a fear of falling?  No   Is the patient reluctant to leave their home because of a fear of falling?  No   Home Environment   Living Enviornment Private residence   Living Arrangements Alone   Available Help at Discharge Friend(s);Family   Type of Home Other(Comment)  townhome   Home Access Stairs to enter   Entrance Stairs-Number of Steps 2   Entrance Stairs-Rails Can reach both   Home Layout Two level;Able to live on main level with bedroom/bathroom   Alternate Level Stairs-Number of Steps one flight   [place] - single point;Shower seat   Prior Function   Level of Independence Independent with basic ADLs;Independent with homemaking with ambulation;Independent with gait;Independent with transfers   Vocation Retired   Leisure go to lunch with friends, travel ([place] in June) and children live in [place]   Overall Cognitive Status Within Functional Limits for tasks assessed   Observation/Other Assessments   Focus on Therapeutic Outcomes (FOTO)  ABC: 96.3%% and Physical Fear adjusted: 47.   Sensation   Light Touch Appears Intact   Coordination   Gross Motor Movements are Fluid and Coordinated Yes  Fine Motor Movements are Fluid and Coordinated Yes   Posture/Postural Control   Posture/Postural Control Postural limitations   Postural Limitations Rounded Shoulders;Forward head   ROM / Strength   AROM / PROM / Strength AROM;Strength   AROM   Overall AROM  Within functional limits for tasks performed   Overall AROM Comments B UE/LE WFL   Strength   Overall Strength Deficits   Overall Strength Comments B UE WFL. B LE: hip flexion: 3+/5, knee ext: 4/5 with back pain, knee flex: 3+/5, hip abd: 3/5, ankle dorsiflexion: 4/5   Transfers   Transfers Sit to Stand;Stand to Sit   Sit to Stand 4: Min guard;With upper extremity assist;From chair/3-in-1   Stand to Sit 5: Supervision;With upper extremity assist;To chair/3-in-1    Ambulation/Gait   Ambulation/Gait Yes   Ambulation/Gait Assistance 5: Supervision   Ambulation/Gait Assistance Details No overt LOB episodes. Pt reported L LE pain that occured during feet together ceased after amb.   Ambulation Distance (Feet) 200 Feet   Assistive device None   Gait Pattern Step-through pattern;Antalgic  decreased eccentric control during swing   Ambulation Surface Level;Indoor   Gait velocity 3.31ft/sec.   Balance   Balance Assessed Yes   Static Standing Balance   Static Standing - Balance Support No upper extremity supported   Static Standing - Level of Assistance 4: Min assist;Other (comment)  min guard   Static Standing - Comment/# of Minutes Feet apart for 30 seconds without LOB, feet together for 10 seconds with increased postural sway and L LE pain, SLS-2 seconds.   Standardized Balance Assessment   Standardized Balance Assessment Timed Up and Go Test   Timed Up and Go Test   TUG Normal TUG   Normal TUG (seconds) 9.51  without AD                           PT Education - 01/07/15 1702    Education provided Yes   Education Details PT discussed the importance of moving/walking/strengthening to decrease back/LE pain. PT discussed frequency/duration of physical therapy.   Person(s) Educated Patient   Methods Explanation   Comprehension Verbalized understanding          PT Short Term Goals - 01/07/15 1707    PT SHORT TERM GOAL #1   Title Same as LTGs.           PT Long Term Goals - 01/07/15 1707    PT LONG TERM GOAL #1   Title Pt will be independent in HEP to improve strength, endurance, balance and to decrease pain. Target date: 02/04/15.   Status New   PT LONG TERM GOAL #2   Title Pt will report L LE pain and back pain </=4/10 to improve quality of life and to perform ADLs. Target date: 02/04/15.   Status New   PT LONG TERM GOAL #3   Title Pt will be able to ambulate 1000', independently, over even/uneven terrain in order to  travel to grand daughter's graduation in [place]. Target date: 02/04/15.   Status New   PT LONG TERM GOAL #4   Title Perform BERG and write appropriate goal. Target date: 02/04/15.   Status New   PT LONG TERM GOAL #5   Title Pt will verbalize plans to join fitness center upon d/c from PT to maintain gains. Target date: 02/04/15.   Status New  Plan - Jan 19, 2015 1419    Clinical Impression Statement Pt is a pleasant 81y/o female presenting OPPT neuro with gait difficulty. Pt reported she has L LE pain and R sided thoracic pain. Pt reported medication does not help but sitting does completely relieve pain. Pt presents with decreased strength, L LE and back pain, impaired balance, and abnormality of gait.  Pt is primarily limited by L LE pain and back pain.  Pt experienced increased postural sway with feet together and required min A during SLS.   Pt will benefit from skilled therapeutic intervention in order to improve on the following deficits Abnormal gait;Impaired flexibility;Decreased endurance;Decreased balance;Decreased mobility;Decreased strength;Pain;Decreased knowledge of use of DME   Rehab Potential Good   Clinical Impairments Affecting Rehab Potential Pt believes that any movement that causes pain should be avoided.   PT Frequency 2x / week   PT Duration 4 weeks   PT Treatment/Interventions ADLs/Self Care Home Management;Gait training;Neuromuscular re-education;Stair training;Patient/family education;Functional mobility training;Therapeutic activities;Cryotherapy;Biofeedback;Electrical Stimulation;Therapeutic exercise;Manual techniques;Balance training;DME Instruction   PT Next Visit Plan Perform BERG, initiate strengthening/balance/flexibility HEP.   Consulted and Agree with Plan of Care Patient          G-Codes - 19-Jan-2015 1711    Functional Assessment Tool Used Gait speed: 3.76ft/sec; TUG: 9.51sec.; single leg stance: 2 seconds then pt required min A to maintain  balance   Functional Limitation Mobility: Walking and moving around   Mobility: Walking and Moving Around Current Status ([id number]) At least 20 percent but less than 40 percent impaired, limited or restricted   Mobility: Walking and Moving Around Goal Status [phone]) At least 1 percent but less than 20 percent impaired, limited or restricted       Problem List Patient Active Problem List   Diagnosis Date Noted  . Neck pain 11/04/2014  . Midline low back pain without sciatica 11/04/2014  . Breast cancer, stage 1 03/14/2011  . Postop check 03/14/2011    Alba Kriesel L 2015-01-19, 5:14 PM  [facility] [address] [place], [place], [address] Phone: [phone]   Fax:  [phone]    Geoffry Paradise, PT,DPT 01/19/15 5:14 PM Phone: [phone] Fax: [phone]

## 2015-01-20 ENCOUNTER — Ambulatory Visit: Payer: Medicare Other | Attending: Sports Medicine

## 2015-01-20 DIAGNOSIS — C50919 Malignant neoplasm of unspecified site of unspecified female breast: Secondary | ICD-10-CM | POA: Diagnosis not present

## 2015-01-20 DIAGNOSIS — M1990 Unspecified osteoarthritis, unspecified site: Secondary | ICD-10-CM | POA: Insufficient documentation

## 2015-01-20 DIAGNOSIS — E039 Hypothyroidism, unspecified: Secondary | ICD-10-CM | POA: Diagnosis not present

## 2015-01-20 DIAGNOSIS — E785 Hyperlipidemia, unspecified: Secondary | ICD-10-CM | POA: Insufficient documentation

## 2015-01-20 DIAGNOSIS — R269 Unspecified abnormalities of gait and mobility: Secondary | ICD-10-CM | POA: Diagnosis not present

## 2015-01-20 DIAGNOSIS — Z8673 Personal history of transient ischemic attack (TIA), and cerebral infarction without residual deficits: Secondary | ICD-10-CM | POA: Diagnosis not present

## 2015-01-20 DIAGNOSIS — I10 Essential (primary) hypertension: Secondary | ICD-10-CM | POA: Diagnosis not present

## 2015-01-20 NOTE — Patient Instructions (Signed)
Perform all balance exercises in a corner with a chair in front of you for safety:  Weight Shift: Anterior / Posterior (Limits of Stability)   Slowly shift weight backward until toes begin to rise off floor. Return to starting position. Shift weight slowly forward until heels begin to rise off floor. Hold each position __2__ seconds. Repeat _20___ times per session. Do __1__ sessions per day.   Copyright  VHI. All rights reserved.  Feet Apart (Compliant Surface) Head Motion - Eyes Open   With eyes open, standing on compliant surface: __pillow______, feet shoulder width apart, move head slowly: up and down and side to side for 30 seconds. Repeat __3__ times per session. Do __1__ sessions per day.  Copyright  VHI. All rights reserved.  Feet Apart (Compliant Surface) Varied Arm Positions - Eyes Closed   Stand on compliant surface: ___pillow_____ with feet shoulder width apart and arms at your side. Close eyes and visualize upright position. Hold__10-30__ seconds. Repeat ___3_ times per session. Do __1__ sessions per day.  Copyright  VHI. All rights reserved.  Feet Heel-Toe "Tandem", Varied Arm Positions - Eyes Open   With eyes open, right foot directly in front of the other, arms at your side, look straight ahead at a stationary object. Hold __30__ seconds. Repeat with other foot in front. Repeat __3__ times per session. Do __1__ sessions per day.  Copyright  VHI. All rights reserved.  Single Leg - Eyes Open   Holding support, lift right leg while maintaining balance over other leg. Progress to removing hands from support surface for longer periods of time. Repeat with other leg lifted. Hold_10-30___ seconds. Repeat __3__ times per session. Do __1__ sessions per day.  Copyright  VHI. All rights reserved.

## 2015-01-20 NOTE — Therapy (Signed)
[facility] [address] [place], [place], [address] Phone: [phone]   Fax:  [phone]  Physical Therapy Treatment  Patient Details  Name: AVALEEN BROWNLEY MRN: [id number] Date of Birth: 29-Jan-1929 Referring Provider:  Crist Infante, MD  Encounter Date: 01/20/2015      PT End of Session - 01/20/15 1636    Visit Number 2   Number of Visits 9   Date for PT Re-Evaluation 02/06/15   Authorization Type G-code every 10th visit.   PT Start Time 1534   PT Stop Time 1623   PT Time Calculation (min) 49 min   Equipment Utilized During Treatment Gait belt   Activity Tolerance Patient tolerated treatment well   Behavior During Therapy WFL for tasks assessed/performed      Past Medical History  Diagnosis Date  . Hypothyroidism   . Hypertension   . Arthritis   . Breast cancer, stage 1 03/14/2011    left  . Hyperlipidemia   . TIA (transient ischemic attack) march 2012    Past Surgical History  Procedure Laterality Date  . Total abdominal hysterectomy    . Finger surgery    . Breast surgery  02/23/11    left lumpectomy  . Tonsillectomy and adenoidectomy      There were no vitals filed for this visit.  Visit Diagnosis:  Abnormality of gait      Subjective Assessment - 01/20/15 1536    Subjective Pt denied falls or changes since last visit.   Pertinent History TIA, HTN, Breast CA, chronic LBP   Patient Stated Goals travel to [place] in June, decrease pain, walk faster   Currently in Pain? Yes   Pain Score 4    Pain Location Back   Pain Orientation Right;Mid;Upper   Pain Descriptors / Indicators Aching   Pain Type Chronic pain   Pain Onset More than a month ago   Pain Frequency Intermittent   Aggravating Factors  lifting heavy objects   Pain Relieving Factors rest   Multiple Pain Sites Yes   Pain Score 8   Pain Location Leg   Pain Orientation Left   Pain Descriptors / Indicators Sharp   Pain Type  Chronic pain   Pain Radiating Towards starts in groin, travels (anterior) distally to L foot   Pain Onset More than a month ago   Pain Frequency Intermittent   Aggravating Factors  crossing L LE   Pain Relieving Factors rest   Effect of Pain on Daily Activities makes it difficult to donn shoes/socks                         [facility] Adult PT Treatment/Exercise - 01/20/15 1541    Balance   Balance Assessed Yes   Static Standing Balance   Static Standing - Balance Support No upper extremity supported;Right upper extremity supported   Static Standing - Level of Assistance 5: Stand by assistance;Other (comment)  min guard   Static Standing - Comment/# of Minutes Performed in corner with chair in front of pt for safety; B LEs, 10-30 sec.holds, 1-3 sets on compliant and non-compliant surfaces: feet together/apart with eyes open/closed, feet together/apart with head turns,  tandem stance, single leg stance (with UE support). VC's for technique. pt noted to experience increased sway during eyes closed and tandem stance. Pt required one seated rest break 2/2 L LE pain.   Dynamic Standing Balance   Dynamic Standing - Balance Support Right  upper extremity supported   Dynamic Standing - Level of Assistance 5: Stand by assistance   Dynamic Standing - Balance Activities Forward lean/weight shifting   Dynamic Standing - Comments ant/post weight shifting x20. VC's for technique.   Standardized Balance Assessment   Standardized Balance Assessment Berg Balance Test   Berg Balance Test   Sit to Stand Able to stand without using hands and stabilize independently   Standing Unsupported Able to stand safely 2 minutes   Sitting with Back Unsupported but Feet Supported on Floor or Stool Able to sit safely and securely 2 minutes   Stand to Sit Sits safely with minimal use of hands   Transfers Able to transfer safely, minor use of hands   Standing Unsupported with Eyes Closed Able to stand 10  seconds safely   Standing Ubsupported with Feet Together Able to place feet together independently and stand for 1 minute with supervision   From Standing, Reach Forward with Outstretched Arm Can reach confidently >25 cm (10")   From Standing Position, Pick up Object from Floor Able to pick up shoe, needs supervision   From Standing Position, Turn to Look Behind Over each Shoulder Looks behind one side only/other side shows less weight shift   Turn 360 Degrees Able to turn 360 degrees safely one side only in 4 seconds or less   Standing Unsupported, Alternately Place Feet on Step/Stool Able to stand independently and complete 8 steps >20 seconds   Standing Unsupported, One Foot in Front Able to plae foot ahead of the other independently and hold 30 seconds   Standing on One Leg Able to lift leg independently and hold equal to or more than 3 seconds  3 seconds, and 10 seconds R foot   Total Score 48                PT Education - 01/20/15 1636    Education provided Yes   Education Details Meaning of BERG score and balance HEP. Educated pt on not ceasing movement due to pain, but to exercise as tolerated as no movement can increase pain.   Person(s) Educated Patient   Methods Explanation;Demonstration;Verbal cues;Handout   Comprehension Verbalized understanding;Returned demonstration          PT Short Term Goals - 01/07/15 1707    PT SHORT TERM GOAL #1   Title Same as LTGs.           PT Long Term Goals - 01/20/15 1638    PT LONG TERM GOAL #1   Title Pt will be independent in HEP to improve strength, endurance, balance and to decrease pain. Target date: 02/04/15.   Status On-going   PT LONG TERM GOAL #2   Title Pt will report L LE pain and back pain </=4/10 to improve quality of life and to perform ADLs. Target date: 02/04/15.   Status On-going   PT LONG TERM GOAL #3   Title Pt will be able to ambulate 1000', independently, over even/uneven terrain in order to travel to  grand daughter's graduation in [place]. Target date: 02/04/15.   Status On-going   PT LONG TERM GOAL #4   Title Perform BERG and write appropriate goal. Target date: 02/04/15.   Status Achieved   PT LONG TERM GOAL #5   Title Pt will verbalize plans to join fitness center upon d/c from PT to maintain gains. Target date: 02/04/15.   Status On-going   Additional Long Term Goals   Additional Long Term Goals Yes  PT LONG TERM GOAL #6   Title Pt will improve BERG score to 52/56 to decrease falls risk. Target date: 02/04/15.   Status New               Plan - 01/20/15 1637    Clinical Impression Statement Pt's BERG score indicates pt is at a moderate risk for falls. Pt experienced L LE pain during feet together and required seated rest break to decrease pain. PT will assess L LE pain next visit. Continue with POC.   Pt will benefit from skilled therapeutic intervention in order to improve on the following deficits Abnormal gait;Impaired flexibility;Decreased endurance;Decreased balance;Decreased mobility;Decreased strength;Pain;Decreased knowledge of use of DME   Rehab Potential Good   Clinical Impairments Affecting Rehab Potential Pt believes that any movement that causes pain should be avoided.   PT Frequency 2x / week   PT Duration 4 weeks   PT Treatment/Interventions ADLs/Self Care Home Management;Gait training;Neuromuscular re-education;Stair training;Patient/family education;Functional mobility training;Therapeutic activities;Cryotherapy;Biofeedback;Electrical Stimulation;Therapeutic exercise;Manual techniques;Balance training;DME Instruction   PT Next Visit Plan Assess L LE pain. initiate strengthening/flexibility HEP.   Consulted and Agree with Plan of Care Patient        Problem List Patient Active Problem List   Diagnosis Date Noted  . Neck pain 11/04/2014  . Midline low back pain without sciatica 11/04/2014  . Breast cancer, stage 1 03/14/2011  . Postop check 03/14/2011     Takeria Marquina L 01/20/2015, 4:40 PM  [facility] [address] [place], [place], [address] Phone: [phone]   Fax:  [phone]     Geoffry Paradise, PT,DPT 01/20/2015 4:40 PM Phone: [phone] Fax: [phone]

## 2015-01-27 ENCOUNTER — Ambulatory Visit: Payer: Medicare Other

## 2015-01-27 DIAGNOSIS — E785 Hyperlipidemia, unspecified: Secondary | ICD-10-CM | POA: Diagnosis not present

## 2015-01-27 DIAGNOSIS — R269 Unspecified abnormalities of gait and mobility: Secondary | ICD-10-CM | POA: Diagnosis not present

## 2015-01-27 DIAGNOSIS — Z8673 Personal history of transient ischemic attack (TIA), and cerebral infarction without residual deficits: Secondary | ICD-10-CM | POA: Diagnosis not present

## 2015-01-27 DIAGNOSIS — E039 Hypothyroidism, unspecified: Secondary | ICD-10-CM | POA: Diagnosis not present

## 2015-01-27 DIAGNOSIS — C50919 Malignant neoplasm of unspecified site of unspecified female breast: Secondary | ICD-10-CM | POA: Diagnosis not present

## 2015-01-27 DIAGNOSIS — I10 Essential (primary) hypertension: Secondary | ICD-10-CM | POA: Diagnosis not present

## 2015-01-27 NOTE — Therapy (Signed)
[facility] [address] [place] [address] [place], [place], [address] Phone: [phone]   Fax:  [phone]  Physical Therapy Treatment  Patient Details  Name: Tanya Morales MRN: [id number] Date of Birth: Nov 08, 1928 Referring Provider:  Crist Infante, MD  Encounter Date: 01/27/2015      PT End of Session - 01/27/15 1638    Visit Number 3   Number of Visits 9   Date for PT Re-Evaluation 02/06/15   Authorization Type G-code every 10th visit.   PT Start Time 1534   PT Stop Time 1630   PT Time Calculation (min) 56 min      Past Medical History  Diagnosis Date  . Hypothyroidism   . Hypertension   . Arthritis   . Breast cancer, stage 1 03/14/2011    left  . Hyperlipidemia   . TIA (transient ischemic attack) march 2012    Past Surgical History  Procedure Laterality Date  . Total abdominal hysterectomy    . Finger surgery    . Breast surgery  02/23/11    left lumpectomy  . Tonsillectomy and adenoidectomy      There were no vitals filed for this visit.  Visit Diagnosis:  Abnormality of gait      Subjective Assessment - 01/27/15 1535    Subjective pt reports she prefers to be in a private room for therapy because it sets her at ease   Currently in Pain? Yes   Pain Score 4    Pain Location Leg   Pain Orientation Left   Pain Type Chronic pain   Pain Onset More than a month ago   Pain Frequency Constant       Self care- education regarding psychosomatic aspect of chronic pain--importance of redefining pain to recognize what the true sensation is "stretch, pressure etc" and to try to decrease guarding of all movements to decrease anticipation of pain. Also explained purpose of each exercise attempted, and performed  Therex: See HEP for some of the exercises taught and provided today-pt performed each of these exercises; initially unable to tolerate left hip flexor stretch in supine due to c/o sharp pain. By end of  session pt able to tolerate it, but will hold off on performing with LLE at home until next session. Will be performing the standing hip flexor stretch at home for R+LLE, and the supine stretch for RLE only.  Performed R and L  Glut max stretch in hooklying with pt initially not tolerating the stretch on the LLE but progressed to tolerate it.   Following standing hip flexor stretch, pt performed AROM of thoracic flexion, extension, sidebending to decrease back pain from prolonged (30 second) holds during the stretch  Also performed resistance through range seated L hip flexion/extension with therapist resisting, then with pt resisting for HEP.  Brief cross friction massage <4 minutes of L hip flexor.                           PT Short Term Goals - 01/07/15 1707    PT SHORT TERM GOAL #1   Title Same as LTGs.           PT Long Term Goals - 01/20/15 1638    PT LONG TERM GOAL #1   Title Pt will be independent in HEP to improve strength, endurance, balance and to decrease pain. Target date: 02/04/15.   Status On-going   PT LONG TERM GOAL #2  Title Pt will report L LE pain and back pain </=4/10 to improve quality of life and to perform ADLs. Target date: 02/04/15.   Status On-going   PT LONG TERM GOAL #3   Title Pt will be able to ambulate 1000', independently, over even/uneven terrain in order to travel to grand daughter's graduation in [place]. Target date: 02/04/15.   Status On-going   PT LONG TERM GOAL #4   Title Perform BERG and write appropriate goal. Target date: 02/04/15.   Status Achieved   PT LONG TERM GOAL #5   Title Pt will verbalize plans to join fitness center upon d/c from PT to maintain gains. Target date: 02/04/15.   Status On-going   Additional Long Term Goals   Additional Long Term Goals Yes   PT LONG TERM GOAL #6   Title Pt will improve BERG score to 52/56 to decrease falls risk. Target date: 02/04/15.   Status New               Plan -  01/27/15 1639    Clinical Impression Statement Pt initially demonstrated extremely guarded movement at start of session, as session progressed with education regarding anticipation of pain as a primary contributor to chronic pain, and as pt began to perform exercise within pain free range of motion she began to tolerate movement better. In addition, deep core activation while performing exercise is effective in decreasing reports of low back pain with mobility. At end of session pt was moving without guarding and reporting she had no pain. HEP provided. Continue per plan of care.   PT Next Visit Plan Review HEP, isometric core strengthening, L glut stretch, progress pt to perform supine L hip flexor stretch as tolerated and clear her to perform this for HEP (already provided in handout for R hip flexor)        Problem List Patient Active Problem List   Diagnosis Date Noted  . Neck pain 11/04/2014  . Midline low back pain without sciatica 11/04/2014  . Breast cancer, stage 1 03/14/2011  . Postop check 03/14/2011   Delrae Sawyers, PT,DPT,NCS 01/27/2015 4:50 PM Phone [phone] FAX [phone]          [facility] [address] [place], [place], [address] Phone: [phone]   Fax:  [phone]

## 2015-01-27 NOTE — Patient Instructions (Signed)
     Hip Flexor Stretch   Lying on back near edge of bed, bend one leg, foot flat. Press your back flat into the bed and hang right leg over edge, relaxed, thigh resting entirely on bed and bend knee until a gentle stretch is felt. Hold 3 minutes daily.  Knee Roll   Lying on back, with knees bent and feet flat on bed, arms outstretched to sides, slowly roll both knees to side, hold 1-2 seconds. Back to starting position, hold1-2 seconds. Then to opposite side, hold 1-2 seconds. Return to starting position. Keep shoulders and arms in contact with floor.  Hip stretch: -Stand facing the back of a chair and hold onto the chair for balance. -lean backwards, pushing your hips forward until a gentle stretch is felt in the groin and upper thigh region.  -hold 30 seconds and perform 3x.    Sit on the edge of your chair. Lift the left leg up like you are marching, while pushing both hands against your thigh to resist it Put the left leg back down while hooking your hands around the leg to resist it. Perform 2 sets of 10 daily. Copyright  VHI. All rights reserved.

## 2015-01-30 ENCOUNTER — Ambulatory Visit: Payer: Medicare Other

## 2015-01-30 DIAGNOSIS — E039 Hypothyroidism, unspecified: Secondary | ICD-10-CM | POA: Diagnosis not present

## 2015-01-30 DIAGNOSIS — R269 Unspecified abnormalities of gait and mobility: Secondary | ICD-10-CM | POA: Diagnosis not present

## 2015-01-30 DIAGNOSIS — C50919 Malignant neoplasm of unspecified site of unspecified female breast: Secondary | ICD-10-CM | POA: Diagnosis not present

## 2015-01-30 DIAGNOSIS — E785 Hyperlipidemia, unspecified: Secondary | ICD-10-CM | POA: Diagnosis not present

## 2015-01-30 DIAGNOSIS — Z8673 Personal history of transient ischemic attack (TIA), and cerebral infarction without residual deficits: Secondary | ICD-10-CM | POA: Diagnosis not present

## 2015-01-30 DIAGNOSIS — I10 Essential (primary) hypertension: Secondary | ICD-10-CM | POA: Diagnosis not present

## 2015-01-30 NOTE — Therapy (Signed)
[facility] [address] [place], [place], [address] Phone: [phone]   Fax:  [phone]  Physical Therapy Treatment  Patient Details  Name: Tanya Morales MRN: [id number] Date of Birth: 08/24/1929 Referring Provider:  Crist Infante, MD  Encounter Date: 01/30/2015      PT End of Session - 01/30/15 1631    Visit Number 4   Number of Visits 9   Date for PT Re-Evaluation 02/06/15   Authorization Type G-code every 10th visit.   PT Start Time 1534   PT Stop Time 1618   PT Time Calculation (min) 44 min   Equipment Utilized During Treatment --  min guard to min A   Activity Tolerance Patient tolerated treatment well   Behavior During Therapy WFL for tasks assessed/performed      Past Medical History  Diagnosis Date  . Hypothyroidism   . Hypertension   . Arthritis   . Breast cancer, stage 1 03/14/2011    left  . Hyperlipidemia   . TIA (transient ischemic attack) march 2012    Past Surgical History  Procedure Laterality Date  . Total abdominal hysterectomy    . Finger surgery    . Breast surgery  02/23/11    left lumpectomy  . Tonsillectomy and adenoidectomy      There were no vitals filed for this visit.  Visit Diagnosis:  Abnormality of gait      Subjective Assessment - 01/30/15 1538    Subjective Pt denied falls since last visit. Pt reported she was not able to perform HEP after last visit due to back pain and she was unsure of how to perform HEP. Pt was pain free for approx. 2 hours.   Pertinent History TIA, HTN, Breast CA, chronic LBP   Patient Stated Goals travel to [place] in June, decrease pain, walk faster   Currently in Pain? No/denies      PT again reiterated  education regarding psychosomatic aspect of chronic pain--importance of redefining pain to recognize what the true sensation is "stretch, pressure etc" and to try to decrease guarding of all movements to decrease anticipation of  pain. Also explained purpose of each exercise attempted. PT explained that movement actually decreases stiffness and pain.  Therex: See HEP for some of the exercises taught and provided today-pt performed each of these exercises. Today, pt was able to tolerate L hip flexor stretch for 3 minutes in supine. She was able to touch L foot to floor at the end of the stretch due to decrease L hip flexor tightness.    Following standing hip flexor stretch, pt performed AROM of thoracic extension to decrease back pain from prolonged (3x30 second) holds during the stretch.  Also performed resistance through range seated L hip flexion/extension with therapist resisting, then with pt resisting for HEP. 2x10.  Gait: Ambulated 500' outdoors over even/uneven/grassy terrain with min guard to min A to maintain balance, with and without head turns. Cues to improve upright posture, trunk rotation, and stride length.                           PT Education - 01/30/15 1629    Education provided Yes   Education Details PT educated pt on the importance of moving, walking, performing HEP to reducd L hip and LBP.   Person(s) Educated Patient   Methods Explanation   Comprehension Verbalized understanding  PT Short Term Goals - 01/07/15 1707    PT SHORT TERM GOAL #1   Title Same as LTGs.           PT Long Term Goals - 01/20/15 1638    PT LONG TERM GOAL #1   Title Pt will be independent in HEP to improve strength, endurance, balance and to decrease pain. Target date: 02/04/15.   Status On-going   PT LONG TERM GOAL #2   Title Pt will report L LE pain and back pain </=4/10 to improve quality of life and to perform ADLs. Target date: 02/04/15.   Status On-going   PT LONG TERM GOAL #3   Title Pt will be able to ambulate 1000', independently, over even/uneven terrain in order to travel to grand daughter's graduation in [place]. Target date: 02/04/15.   Status On-going   PT LONG  TERM GOAL #4   Title Perform BERG and write appropriate goal. Target date: 02/04/15.   Status Achieved   PT LONG TERM GOAL #5   Title Pt will verbalize plans to join fitness center upon d/c from PT to maintain gains. Target date: 02/04/15.   Status On-going   Additional Long Term Goals   Additional Long Term Goals Yes   PT LONG TERM GOAL #6   Title Pt will improve BERG score to 52/56 to decrease falls risk. Target date: 02/04/15.   Status New               Plan - 01/30/15 1631    Clinical Impression Statement Pt again demonstrated guarded movement at start of session, and progressed to more fluid movement after performing stretches. Pt reported LBP and L hip reduced to 0/10 at end of session, after amb. Continue with POC.   Pt will benefit from skilled therapeutic intervention in order to improve on the following deficits Abnormal gait;Impaired flexibility;Decreased endurance;Decreased balance;Decreased mobility;Decreased strength;Pain;Decreased knowledge of use of DME   Rehab Potential Good   Clinical Impairments Affecting Rehab Potential Pt believes that any movement that causes pain should be avoided.   PT Frequency 2x / week   PT Duration 4 weeks   PT Treatment/Interventions ADLs/Self Care Home Management;Gait training;Neuromuscular re-education;Stair training;Patient/family education;Functional mobility training;Therapeutic activities;Cryotherapy;Biofeedback;Electrical Stimulation;Therapeutic exercise;Manual techniques;Balance training;DME Instruction   PT Next Visit Plan Begin assessing goals.   Consulted and Agree with Plan of Care Patient        Problem List Patient Active Problem List   Diagnosis Date Noted  . Neck pain 11/04/2014  . Midline low back pain without sciatica 11/04/2014  . Breast cancer, stage 1 03/14/2011  . Postop check 03/14/2011    Gaylene Moylan L 01/30/2015, 4:34 PM  [facility] [address] [place], [place], [address] Phone: [phone]   Fax:  [phone]     Geoffry Paradise, PT,DPT 01/30/2015 4:34 PM Phone: [phone] Fax: [phone]

## 2015-02-03 ENCOUNTER — Other Ambulatory Visit: Payer: Self-pay | Admitting: Internal Medicine

## 2015-02-03 DIAGNOSIS — M859 Disorder of bone density and structure, unspecified: Secondary | ICD-10-CM | POA: Diagnosis not present

## 2015-02-03 DIAGNOSIS — Z853 Personal history of malignant neoplasm of breast: Secondary | ICD-10-CM

## 2015-02-04 ENCOUNTER — Ambulatory Visit: Payer: Medicare Other

## 2015-02-04 DIAGNOSIS — R269 Unspecified abnormalities of gait and mobility: Secondary | ICD-10-CM

## 2015-02-04 DIAGNOSIS — I10 Essential (primary) hypertension: Secondary | ICD-10-CM | POA: Diagnosis not present

## 2015-02-04 DIAGNOSIS — E039 Hypothyroidism, unspecified: Secondary | ICD-10-CM | POA: Diagnosis not present

## 2015-02-04 DIAGNOSIS — Z8673 Personal history of transient ischemic attack (TIA), and cerebral infarction without residual deficits: Secondary | ICD-10-CM | POA: Diagnosis not present

## 2015-02-04 DIAGNOSIS — C50919 Malignant neoplasm of unspecified site of unspecified female breast: Secondary | ICD-10-CM | POA: Diagnosis not present

## 2015-02-04 DIAGNOSIS — E785 Hyperlipidemia, unspecified: Secondary | ICD-10-CM | POA: Diagnosis not present

## 2015-02-04 NOTE — Therapy (Signed)
[facility] [address] [place], [place], [address] Phone: [phone]   Fax:  [phone]  Physical Therapy Treatment  Patient Details  Name: Tanya Morales MRN: [id number] Date of Birth: 1929/07/26 Referring Provider:  Crist Infante, MD  Encounter Date: 02/04/2015      PT End of Session - 02/04/15 1633    Visit Number 5   Number of Visits 9   Date for PT Re-Evaluation 02/06/15   Authorization Type G-code every 10th visit.   PT Start Time 1531   PT Stop Time 1616   PT Time Calculation (min) 45 min   Equipment Utilized During Treatment Gait belt   Activity Tolerance Patient tolerated treatment well   Behavior During Therapy WFL for tasks assessed/performed      Past Medical History  Diagnosis Date  . Hypothyroidism   . Hypertension   . Arthritis   . Breast cancer, stage 1 03/14/2011    left  . Hyperlipidemia   . TIA (transient ischemic attack) march 2012    Past Surgical History  Procedure Laterality Date  . Total abdominal hysterectomy    . Finger surgery    . Breast surgery  02/23/11    left lumpectomy  . Tonsillectomy and adenoidectomy      There were no vitals filed for this visit.  Visit Diagnosis:  Abnormality of gait      Subjective Assessment - 02/04/15 1534    Subjective Pt reported she had a bone density scan yesterday and she received the results today. Pt stated she was told that she has osteopenia in the R hip.  Pt denied falls since last visit.  Pt reported L hip/LE pain ranges from 0/10 to 2-3/10.   Pertinent History TIA, HTN, Breast CA, chronic LBP   Patient Stated Goals travel to [place] in June, decrease pain, walk faster   Currently in Pain? Yes   Pain Score 2    Pain Location Leg   Pain Orientation Left   Pain Descriptors / Indicators Aching   Pain Type Chronic pain   Pain Onset More than a month ago   Pain Frequency Intermittent                          [facility] Adult PT Treatment/Exercise - 02/04/15 1547    Ambulation/Gait   Ambulation/Gait Yes   Ambulation/Gait Assistance 7: Independent   Ambulation/Gait Assistance Details Pt ambulated over uneven/even terrain while performing head turns without LOB. Pt reported L hip pain decreased to 0/10 after amb.   Ambulation Distance (Feet) 1100 Feet   Assistive device None   Gait Pattern Step-through pattern;Antalgic   Ambulation Surface Level;Unlevel;Indoor;Outdoor;Paved;Grass   Standardized Balance Assessment   Standardized Balance Assessment Berg Balance Test   Berg Balance Test   Sit to Stand Able to stand without using hands and stabilize independently   Standing Unsupported Able to stand safely 2 minutes   Sitting with Back Unsupported but Feet Supported on Floor or Stool Able to sit safely and securely 2 minutes   Stand to Sit Sits safely with minimal use of hands   Transfers Able to transfer safely, minor use of hands   Standing Unsupported with Eyes Closed Able to stand 10 seconds safely   Standing Ubsupported with Feet Together Able to place feet together independently and stand 1 minute safely   From Standing, Reach Forward with Outstretched Arm Can reach confidently >25 cm (10")  From Standing Position, Pick up Object from [place] to pick up shoe safely and easily   From Standing Position, Turn to Look Behind Over each Shoulder Looks behind one side only/other side shows less weight shift   Turn 360 Degrees Able to turn 360 degrees safely one side only in 4 seconds or less   Standing Unsupported, Alternately Place Feet on Step/Stool Able to stand independently and safely and complete 8 steps in 20 seconds   Standing Unsupported, One Foot in [place] to place foot tandem independently and hold 30 seconds   Standing on One Leg Able to lift leg independently and hold > 10 seconds   Total Score 54                PT Education - Feb 09, 2015 1631    Education provided Yes    Education Details PT discussed goal status and discharge from PT due to meeting goals, pt agreeable as she had to cancel next week appointments due to house repairs and travel.  PT reviewed HEP with pt. PT educated pt on the importance continuing HEP at least 3x/week to maintain gains made in PT and to continue to decrease L hip pain.. PT encouraged pt to walk with a friend to maintain improvements and pt stated she might join a gym and utilize a Customer service manager.   Person(s) Educated Patient   Methods Explanation   Comprehension Verbalized understanding          PT Short Term Goals - 01/07/15 1707    PT SHORT TERM GOAL #1   Title Same as LTGs.           PT Long Term Goals - 02/09/15 1633    PT LONG TERM GOAL #1   Title Pt will be independent in HEP to improve strength, endurance, balance and to decrease pain. Target date: 09-Feb-2015.   Status Achieved   PT LONG TERM GOAL #2   Title Pt will report L LE pain and back pain </=4/10 to improve quality of life and to perform ADLs. Target date: 02-09-15.   Status Achieved   PT LONG TERM GOAL #3   Title Pt will be able to ambulate 1000', independently, over even/uneven terrain in order to travel to grand daughter's graduation in [place]. Target date: 02-09-15.   Status Achieved   PT LONG TERM GOAL #4   Title Perform BERG and write appropriate goal. Target date: 2015/02/09.   Status Achieved   PT LONG TERM GOAL #5   Title Pt will verbalize plans to join fitness center upon d/c from PT to maintain gains. Target date: 2015-02-09.   Status Achieved   PT LONG TERM GOAL #6   Title Pt will improve BERG score to 52/56 to decrease falls risk. Target date: 02-09-2015.   Status Achieved               Plan - 02-09-2015 1633    Clinical Impression Statement Pt met all LTGs, see d/c summary for details.          G-Codes - 02-09-2015 1636    Functional Assessment Tool Used Gait speed and TUG WFL at eval. Pt's BERG improved from 48/56 to 54/56 and pt  performed single leg stance for 10 seconds.   Functional Limitation Mobility: Walking and moving around   Mobility: Walking and Moving Around Goal Status [phone]) At least 1 percent but less than 20 percent impaired, limited or restricted   Mobility: Walking and Moving Around Discharge  Status [phone]) At least 1 percent but less than 20 percent impaired, limited or restricted      Problem List Patient Active Problem List   Diagnosis Date Noted  . Neck pain 11/04/2014  . Midline low back pain without sciatica 11/04/2014  . Breast cancer, stage 1 03/14/2011  . Postop check 03/14/2011    Miller,Jennifer L 02/04/2015, 4:39 PM  [facility] [address] [place], [place], [address] Phone: [phone]   Fax:  [phone]  PHYSICAL THERAPY DISCHARGE SUMMARY  Visits from Start of Care: 5  Current functional level related to goals / functional outcomes:     PT Long Term Goals - 02/04/15 1633    PT LONG TERM GOAL #1   Title Pt will be independent in HEP to improve strength, endurance, balance and to decrease pain. Target date: 02/04/15.   Status Achieved   PT LONG TERM GOAL #2   Title Pt will report L LE pain and back pain </=4/10 to improve quality of life and to perform ADLs. Target date: 02/04/15.   Status Achieved   PT LONG TERM GOAL #3   Title Pt will be able to ambulate 1000', independently, over even/uneven terrain in order to travel to grand daughter's graduation in [place]. Target date: 02/04/15.   Status Achieved   PT LONG TERM GOAL #4   Title Perform BERG and write appropriate goal. Target date: 02/04/15.   Status Achieved   PT LONG TERM GOAL #5   Title Pt will verbalize plans to join fitness center upon d/c from PT to maintain gains. Target date: 02/04/15.   Status Achieved   PT LONG TERM GOAL #6   Title Pt will improve BERG score to 52/56 to decrease falls risk. Target date: 02/04/15.   Status Achieved         Remaining deficits: Intermittent L hip/LE pain which can make it difficulty to perform narrow BOS activities.   Education / Equipment: HEP  Plan: Patient agrees to discharge.  Patient goals were met. Patient is being discharged due to meeting the stated rehab goals.  ?????        Geoffry Paradise, PT,DPT 02/04/2015 4:39 PM Phone: [phone] Fax: [phone]

## 2015-02-10 ENCOUNTER — Ambulatory Visit: Payer: Medicare Other

## 2015-02-11 ENCOUNTER — Ambulatory Visit: Payer: Medicare Other

## 2015-02-17 ENCOUNTER — Ambulatory Visit: Payer: Medicare Other | Admitting: Physical Therapy

## 2015-02-19 ENCOUNTER — Ambulatory Visit: Payer: Medicare Other | Admitting: Physical Therapy

## 2015-02-28 DIAGNOSIS — H2640 Unspecified secondary cataract: Secondary | ICD-10-CM | POA: Diagnosis not present

## 2015-02-28 DIAGNOSIS — H1859 Other hereditary corneal dystrophies: Secondary | ICD-10-CM | POA: Diagnosis not present

## 2015-03-04 DIAGNOSIS — L821 Other seborrheic keratosis: Secondary | ICD-10-CM | POA: Diagnosis not present

## 2015-03-04 DIAGNOSIS — L661 Lichen planopilaris: Secondary | ICD-10-CM | POA: Diagnosis not present

## 2015-03-10 DIAGNOSIS — H01025 Squamous blepharitis left lower eyelid: Secondary | ICD-10-CM | POA: Diagnosis not present

## 2015-03-10 DIAGNOSIS — H01024 Squamous blepharitis left upper eyelid: Secondary | ICD-10-CM | POA: Diagnosis not present

## 2015-03-10 DIAGNOSIS — H26491 Other secondary cataract, right eye: Secondary | ICD-10-CM | POA: Diagnosis not present

## 2015-03-10 DIAGNOSIS — H43811 Vitreous degeneration, right eye: Secondary | ICD-10-CM | POA: Diagnosis not present

## 2015-03-10 DIAGNOSIS — H04123 Dry eye syndrome of bilateral lacrimal glands: Secondary | ICD-10-CM | POA: Diagnosis not present

## 2015-03-10 DIAGNOSIS — H01022 Squamous blepharitis right lower eyelid: Secondary | ICD-10-CM | POA: Diagnosis not present

## 2015-03-10 DIAGNOSIS — Z961 Presence of intraocular lens: Secondary | ICD-10-CM | POA: Diagnosis not present

## 2015-03-10 DIAGNOSIS — H01021 Squamous blepharitis right upper eyelid: Secondary | ICD-10-CM | POA: Diagnosis not present

## 2015-03-13 ENCOUNTER — Ambulatory Visit
Admission: RE | Admit: 2015-03-13 | Discharge: 2015-03-13 | Disposition: A | Discharge status: Home / Self Care | Payer: Medicare Other | Source: Ambulatory Visit | Attending: Internal Medicine | Admitting: Internal Medicine

## 2015-03-13 DIAGNOSIS — Z853 Personal history of malignant neoplasm of breast: Secondary | ICD-10-CM

## 2015-03-13 DIAGNOSIS — R928 Other abnormal and inconclusive findings on diagnostic imaging of breast: Secondary | ICD-10-CM | POA: Diagnosis not present

## 2015-04-21 DIAGNOSIS — M859 Disorder of bone density and structure, unspecified: Secondary | ICD-10-CM | POA: Diagnosis not present

## 2015-04-21 DIAGNOSIS — Z6823 Body mass index (BMI) 23.0-23.9, adult: Secondary | ICD-10-CM | POA: Diagnosis not present

## 2015-05-05 DIAGNOSIS — L821 Other seborrheic keratosis: Secondary | ICD-10-CM | POA: Diagnosis not present

## 2015-05-05 DIAGNOSIS — L661 Lichen planopilaris: Secondary | ICD-10-CM | POA: Diagnosis not present

## 2015-05-05 DIAGNOSIS — D1801 Hemangioma of skin and subcutaneous tissue: Secondary | ICD-10-CM | POA: Diagnosis not present

## 2015-05-20 DIAGNOSIS — H00031 Abscess of right upper eyelid: Secondary | ICD-10-CM | POA: Diagnosis not present

## 2015-06-22 ENCOUNTER — Encounter (HOSPITAL_COMMUNITY): Payer: Self-pay

## 2015-06-22 ENCOUNTER — Ambulatory Visit (HOSPITAL_COMMUNITY)
Admission: RE | Admit: 2015-06-22 | Discharge: 2015-06-22 | Disposition: A | Discharge status: Home / Self Care | Payer: Medicare Other | Source: Ambulatory Visit | Attending: Internal Medicine | Admitting: Internal Medicine

## 2015-06-22 DIAGNOSIS — M8580 Other specified disorders of bone density and structure, unspecified site: Secondary | ICD-10-CM | POA: Insufficient documentation

## 2015-06-22 MED ORDER — DENOSUMAB 60 MG/ML ~~LOC~~ SOLN
60.0000 mg | Freq: Once | SUBCUTANEOUS | Status: AC
  Administered 2015-06-22: 60 mg via SUBCUTANEOUS
  Filled 2015-06-22: qty 1

## 2015-06-22 NOTE — Progress Notes (Signed)
Uneventful 1st injection of Prolia and post injection 20  minute observation. Next scheduled appointment is in 6 months. Pt verbalized understanding. Pt discharged ambulatory unaccompanied to elevator to go to valet parking

## 2015-06-22 NOTE — Discharge Instructions (Signed)
Prolia Denosumab injection What is this medicine? DENOSUMAB (den oh sue mab) slows bone breakdown. Prolia is used to treat osteoporosis in women after menopause and in men. Delton See is used to prevent bone fractures and other bone problems caused by cancer bone metastases. Delton See is also used to treat giant cell tumor of the bone. This medicine may be used for other purposes; ask your health care provider or pharmacist if you have questions. COMMON BRAND NAME(S): Prolia, XGEVA What should I tell my health care provider before I take this medicine? They need to know if you have any of these conditions: -dental disease -eczema -infection or history of infections -kidney disease or on dialysis -low blood calcium or vitamin D -malabsorption syndrome -scheduled to have surgery or tooth extraction -taking medicine that contains denosumab -thyroid or parathyroid disease -an unusual reaction to denosumab, other medicines, foods, dyes, or preservatives -pregnant or trying to get pregnant -breast-feeding How should I use this medicine? This medicine is for injection under the skin. It is given by a health care professional in a hospital or clinic setting. If you are getting Prolia, a special MedGuide will be given to you by the pharmacist with each prescription and refill. Be sure to read this information carefully each time. For Prolia, talk to your pediatrician regarding the use of this medicine in children. Special care may be needed. For Delton See, talk to your pediatrician regarding the use of this medicine in children. While this drug may be prescribed for children as young as 13 years for selected conditions, precautions do apply. Overdosage: If you think you've taken too much of this medicine contact a poison control center or emergency room at once. Overdosage: If you think you have taken too much of this medicine contact a poison control center or emergency room at once. NOTE: This medicine is  only for you. Do not share this medicine with others. What if I miss a dose? It is important not to miss your dose. Call your doctor or health care professional if you are unable to keep an appointment. What may interact with this medicine? Do not take this medicine with any of the following medications: -other medicines containing denosumab This medicine may also interact with the following medications: -medicines that suppress the immune system -medicines that treat cancer -steroid medicines like prednisone or cortisone This list may not describe all possible interactions. Give your health care provider a list of all the medicines, herbs, non-prescription drugs, or dietary supplements you use. Also tell them if you smoke, drink alcohol, or use illegal drugs. Some items may interact with your medicine. What should I watch for while using this medicine? Visit your doctor or health care professional for regular checks on your progress. Your doctor or health care professional may order blood tests and other tests to see how you are doing. Call your doctor or health care professional if you get a cold or other infection while receiving this medicine. Do not treat yourself. This medicine may decrease your body's ability to fight infection. You should make sure you get enough calcium and vitamin D while you are taking this medicine, unless your doctor tells you not to. Discuss the foods you eat and the vitamins you take with your health care professional. See your dentist regularly. Brush and floss your teeth as directed. Before you have any dental work done, tell your dentist you are receiving this medicine. Do not become pregnant while taking this medicine or for 5 months after  stopping it. Women should inform their doctor if they wish to become pregnant or think they might be pregnant. There is a potential for serious side effects to an unborn child. Talk to your health care professional or pharmacist  for more information. What side effects may I notice from receiving this medicine? Side effects that you should report to your doctor or health care professional as soon as possible: -allergic reactions like skin rash, itching or hives, swelling of the face, lips, or tongue -breathing problems -chest pain -fast, irregular heartbeat -feeling faint or lightheaded, falls -fever, chills, or any other sign of infection -muscle spasms, tightening, or twitches -numbness or tingling -skin blisters or bumps, or is dry, peels, or red -slow healing or unexplained pain in the mouth or jaw -unusual bleeding or bruising Side effects that usually do not require medical attention (Report these to your doctor or health care professional if they continue or are bothersome.): -muscle pain -stomach upset, gas This list may not describe all possible side effects. Call your doctor for medical advice about side effects. You may report side effects to FDA at 1-800-FDA-1088. Where should I keep my medicine? This medicine is only given in a clinic, doctor's office, or other health care setting and will not be stored at home. NOTE: This sheet is a summary. It may not cover all possible information. If you have questions about this medicine, talk to your doctor, pharmacist, or health care provider.  2015, Elsevier/Gold Standard. (2012-03-05 12:37:47) Osteoporosis Throughout your life, your body breaks down old bone and replaces it with new bone. As you get older, your body does not replace bone as quickly as it breaks it down. By the age of 33 years, most people begin to gradually lose bone because of the imbalance between bone loss and replacement. Some people lose more bone than others. Bone loss beyond a specified normal degree is considered osteoporosis.  Osteoporosis affects the strength and durability of your bones. The inside of the ends of your bones and your flat bones, like the bones of your pelvis, look like  honeycomb, filled with tiny open spaces. As bone loss occurs, your bones become less dense. This means that the open spaces inside your bones become bigger and the walls between these spaces become thinner. This makes your bones weaker. Bones of a person with osteoporosis can become so weak that they can break (fracture) during minor accidents, such as a simple fall. CAUSES  The following factors have been associated with the development of osteoporosis:  Smoking.  Drinking more than 2 alcoholic drinks several days per week.  Long-term use of certain medicines:  Corticosteroids.  Chemotherapy medicines.  Thyroid medicines.  Antiepileptic medicines.  Gonadal hormone suppression medicine.  Immunosuppression medicine.  Being underweight.  Lack of physical activity.  Lack of exposure to the sun. This can lead to vitamin D deficiency.  Certain medical conditions:  Certain inflammatory bowel diseases, such as Crohn disease and ulcerative colitis.  Diabetes.  Hyperthyroidism.  Hyperparathyroidism. RISK FACTORS Anyone can develop osteoporosis. However, the following factors can increase your risk of developing osteoporosis:  Gender--Women are at higher risk than men.  Age--Being older than 50 years increases your risk.  Ethnicity--White and Asian people have an increased risk.  Weight --Being extremely underweight can increase your risk of osteoporosis.  Family history of osteoporosis--Having a family member who has developed osteoporosis can increase your risk. SYMPTOMS  Usually, people with osteoporosis have no symptoms.  DIAGNOSIS  Signs during  a physical exam that may prompt your caregiver to suspect osteoporosis include: °· Decreased height. This is usually caused by the compression of the bones that form your spine (vertebrae) because they have weakened and become fractured. °· A curving or rounding of the upper back (kyphosis). °To confirm signs of osteoporosis,  your caregiver may request a procedure that uses 2 low-dose X-ray beams with different levels of energy to measure your bone mineral density (dual-energy X-ray absorptiometry [DXA]). Also, your caregiver may check your level of vitamin D. °TREATMENT  °The goal of osteoporosis treatment is to strengthen bones in order to decrease the risk of bone fractures. There are different types of medicines available to help achieve this goal. Some of these medicines work by slowing the processes of bone loss. Some medicines work by increasing bone density. Treatment also involves making sure that your levels of calcium and vitamin D are adequate. °PREVENTION  °There are things you can do to help prevent osteoporosis. Adequate intake of calcium and vitamin D can help you achieve optimal bone mineral density. Regular exercise can also help, especially resistance and weight-bearing activities. If you smoke, quitting smoking is an important part of osteoporosis prevention. °MAKE SURE YOU: °· Understand these instructions. °· Will watch your condition. °· Will get help right away if you are not doing well or get worse. °FOR MORE INFORMATION °www.osteo.org and www.nof.org °Document Released: 06/15/2005 Document Revised: 12/31/2012 Document Reviewed: 08/20/2011 °ExitCare® Patient Information ©2015 [employer]. This information is not intended to replace advice given to you by your health care provider. Make sure you discuss any questions you have with your health care provider. ° ° °

## 2015-07-01 DIAGNOSIS — L821 Other seborrheic keratosis: Secondary | ICD-10-CM | POA: Diagnosis not present

## 2015-07-01 DIAGNOSIS — L661 Lichen planopilaris: Secondary | ICD-10-CM | POA: Diagnosis not present

## 2015-07-13 DIAGNOSIS — Z23 Encounter for immunization: Secondary | ICD-10-CM | POA: Diagnosis not present

## 2015-07-28 DIAGNOSIS — H02052 Trichiasis without entropian right lower eyelid: Secondary | ICD-10-CM | POA: Diagnosis not present

## 2015-08-05 DIAGNOSIS — L718 Other rosacea: Secondary | ICD-10-CM | POA: Diagnosis not present

## 2015-08-05 DIAGNOSIS — L661 Lichen planopilaris: Secondary | ICD-10-CM | POA: Diagnosis not present

## 2015-08-26 DIAGNOSIS — N390 Urinary tract infection, site not specified: Secondary | ICD-10-CM | POA: Diagnosis not present

## 2015-08-26 DIAGNOSIS — E784 Other hyperlipidemia: Secondary | ICD-10-CM | POA: Diagnosis not present

## 2015-08-26 DIAGNOSIS — E039 Hypothyroidism, unspecified: Secondary | ICD-10-CM | POA: Diagnosis not present

## 2015-08-26 DIAGNOSIS — D539 Nutritional anemia, unspecified: Secondary | ICD-10-CM | POA: Diagnosis not present

## 2015-08-26 DIAGNOSIS — M859 Disorder of bone density and structure, unspecified: Secondary | ICD-10-CM | POA: Diagnosis not present

## 2015-08-26 DIAGNOSIS — I10 Essential (primary) hypertension: Secondary | ICD-10-CM | POA: Diagnosis not present

## 2015-08-26 DIAGNOSIS — R8299 Other abnormal findings in urine: Secondary | ICD-10-CM | POA: Diagnosis not present

## 2015-08-26 DIAGNOSIS — H00031 Abscess of right upper eyelid: Secondary | ICD-10-CM | POA: Diagnosis not present

## 2015-08-27 DIAGNOSIS — D692 Other nonthrombocytopenic purpura: Secondary | ICD-10-CM | POA: Diagnosis not present

## 2015-08-27 DIAGNOSIS — L661 Lichen planopilaris: Secondary | ICD-10-CM | POA: Diagnosis not present

## 2015-08-27 DIAGNOSIS — L821 Other seborrheic keratosis: Secondary | ICD-10-CM | POA: Diagnosis not present

## 2015-08-27 DIAGNOSIS — D1801 Hemangioma of skin and subcutaneous tissue: Secondary | ICD-10-CM | POA: Diagnosis not present

## 2015-08-27 DIAGNOSIS — L814 Other melanin hyperpigmentation: Secondary | ICD-10-CM | POA: Diagnosis not present

## 2015-09-07 DIAGNOSIS — Z0000 Encounter for general adult medical examination without abnormal findings: Secondary | ICD-10-CM | POA: Diagnosis not present

## 2015-09-07 DIAGNOSIS — R300 Dysuria: Secondary | ICD-10-CM | POA: Diagnosis not present

## 2015-09-07 DIAGNOSIS — R55 Syncope and collapse: Secondary | ICD-10-CM | POA: Diagnosis not present

## 2015-09-07 DIAGNOSIS — R7301 Impaired fasting glucose: Secondary | ICD-10-CM | POA: Insufficient documentation

## 2015-09-07 DIAGNOSIS — Z1231 Encounter for screening mammogram for malignant neoplasm of breast: Secondary | ICD-10-CM | POA: Diagnosis not present

## 2015-09-07 DIAGNOSIS — M533 Sacrococcygeal disorders, not elsewhere classified: Secondary | ICD-10-CM | POA: Diagnosis not present

## 2015-09-07 DIAGNOSIS — H9202 Otalgia, left ear: Secondary | ICD-10-CM | POA: Diagnosis not present

## 2015-09-07 DIAGNOSIS — R634 Abnormal weight loss: Secondary | ICD-10-CM | POA: Diagnosis not present

## 2015-09-07 DIAGNOSIS — M545 Low back pain: Secondary | ICD-10-CM | POA: Diagnosis not present

## 2015-09-07 DIAGNOSIS — Z6823 Body mass index (BMI) 23.0-23.9, adult: Secondary | ICD-10-CM | POA: Diagnosis not present

## 2015-09-07 DIAGNOSIS — G4709 Other insomnia: Secondary | ICD-10-CM | POA: Diagnosis not present

## 2015-09-07 DIAGNOSIS — Z1389 Encounter for screening for other disorder: Secondary | ICD-10-CM | POA: Diagnosis not present

## 2015-10-01 DIAGNOSIS — L661 Lichen planopilaris: Secondary | ICD-10-CM | POA: Diagnosis not present

## 2015-10-20 DIAGNOSIS — M859 Disorder of bone density and structure, unspecified: Secondary | ICD-10-CM | POA: Diagnosis not present

## 2015-10-20 DIAGNOSIS — Z6823 Body mass index (BMI) 23.0-23.9, adult: Secondary | ICD-10-CM | POA: Diagnosis not present

## 2015-12-03 DIAGNOSIS — L661 Lichen planopilaris: Secondary | ICD-10-CM | POA: Diagnosis not present

## 2015-12-21 ENCOUNTER — Encounter (HOSPITAL_COMMUNITY): Payer: Self-pay

## 2015-12-22 ENCOUNTER — Ambulatory Visit (HOSPITAL_COMMUNITY)
Admission: RE | Admit: 2015-12-22 | Discharge: 2015-12-22 | Disposition: A | Discharge status: Home / Self Care | Payer: Medicare Other | Source: Ambulatory Visit | Attending: Internal Medicine | Admitting: Internal Medicine

## 2015-12-30 DIAGNOSIS — R300 Dysuria: Secondary | ICD-10-CM | POA: Diagnosis not present

## 2016-01-07 ENCOUNTER — Ambulatory Visit (HOSPITAL_COMMUNITY)
Admission: RE | Admit: 2016-01-07 | Discharge: 2016-01-07 | Disposition: A | Discharge status: Home / Self Care | Payer: Medicare Other | Source: Ambulatory Visit | Attending: Internal Medicine | Admitting: Internal Medicine

## 2016-01-07 ENCOUNTER — Encounter (HOSPITAL_COMMUNITY): Payer: Self-pay

## 2016-01-07 DIAGNOSIS — M810 Age-related osteoporosis without current pathological fracture: Secondary | ICD-10-CM | POA: Insufficient documentation

## 2016-01-07 MED ORDER — DENOSUMAB 60 MG/ML ~~LOC~~ SOLN
60.0000 mg | Freq: Once | SUBCUTANEOUS | Status: AC
  Administered 2016-01-07: 60 mg via SUBCUTANEOUS
  Filled 2016-01-07: qty 1

## 2016-01-07 NOTE — Discharge Instructions (Signed)
Denosumab injection  What is this medicine?  DENOSUMAB (den oh sue mab) slows bone breakdown. Prolia is used to treat osteoporosis in women after menopause and in men. Xgeva is used to prevent bone fractures and other bone problems caused by cancer bone metastases. Xgeva is also used to treat giant cell tumor of the bone.  This medicine may be used for other purposes; ask your health care provider or pharmacist if you have questions.  What should I tell my health care provider before I take this medicine?  They need to know if you have any of these conditions:  -dental disease  -eczema  -infection or history of infections  -kidney disease or on dialysis  -low blood calcium or vitamin D  -malabsorption syndrome  -scheduled to have surgery or tooth extraction  -taking medicine that contains denosumab  -thyroid or parathyroid disease  -an unusual reaction to denosumab, other medicines, foods, dyes, or preservatives  -pregnant or trying to get pregnant  -breast-feeding  How should I use this medicine?  This medicine is for injection under the skin. It is given by a health care professional in a hospital or clinic setting.  If you are getting Prolia, a special MedGuide will be given to you by the pharmacist with each prescription and refill. Be sure to read this information carefully each time.  For Prolia, talk to your pediatrician regarding the use of this medicine in children. Special care may be needed. For Xgeva, talk to your pediatrician regarding the use of this medicine in children. While this drug may be prescribed for children as young as 13 years for selected conditions, precautions do apply.  Overdosage: If you think you have taken too much of this medicine contact a poison control center or emergency room at once.  NOTE: This medicine is only for you. Do not share this medicine with others.  What if I miss a dose?  It is important not to miss your dose. Call your doctor or health care professional if you are  unable to keep an appointment.  What may interact with this medicine?  Do not take this medicine with any of the following medications:  -other medicines containing denosumab  This medicine may also interact with the following medications:  -medicines that suppress the immune system  -medicines that treat cancer  -steroid medicines like prednisone or cortisone  This list may not describe all possible interactions. Give your health care provider a list of all the medicines, herbs, non-prescription drugs, or dietary supplements you use. Also tell them if you smoke, drink alcohol, or use illegal drugs. Some items may interact with your medicine.  What should I watch for while using this medicine?  Visit your doctor or health care professional for regular checks on your progress. Your doctor or health care professional may order blood tests and other tests to see how you are doing.  Call your doctor or health care professional if you get a cold or other infection while receiving this medicine. Do not treat yourself. This medicine may decrease your body's ability to fight infection.  You should make sure you get enough calcium and vitamin D while you are taking this medicine, unless your doctor tells you not to. Discuss the foods you eat and the vitamins you take with your health care professional.  See your dentist regularly. Brush and floss your teeth as directed. Before you have any dental work done, tell your dentist you are receiving this medicine.  Do   not become pregnant while taking this medicine or for 5 months after stopping it. Women should inform their doctor if they wish to become pregnant or think they might be pregnant. There is a potential for serious side effects to an unborn child. Talk to your health care professional or pharmacist for more information.  What side effects may I notice from receiving this medicine?  Side effects that you should report to your doctor or health care professional as soon as  possible:  -allergic reactions like skin rash, itching or hives, swelling of the face, lips, or tongue  -breathing problems  -chest pain  -fast, irregular heartbeat  -feeling faint or lightheaded, falls  -fever, chills, or any other sign of infection  -muscle spasms, tightening, or twitches  -numbness or tingling  -skin blisters or bumps, or is dry, peels, or red  -slow healing or unexplained pain in the mouth or jaw  -unusual bleeding or bruising  Side effects that usually do not require medical attention (Report these to your doctor or health care professional if they continue or are bothersome.):  -muscle pain  -stomach upset, gas  This list may not describe all possible side effects. Call your doctor for medical advice about side effects. You may report side effects to FDA at 1-800-FDA-1088.  Where should I keep my medicine?  This medicine is only given in a clinic, doctor's office, or other health care setting and will not be stored at home.  NOTE: This sheet is a summary. It may not cover all possible information. If you have questions about this medicine, talk to your doctor, pharmacist, or health care provider.      2016, Elsevier/Gold Standard. (2012-03-05 12:37:47)

## 2016-01-07 NOTE — Progress Notes (Signed)
Patient states she has not taken blood pressure meds today. Instructed to follow up with primary care physician. Verbalizes understanding.

## 2016-02-04 DIAGNOSIS — L661 Lichen planopilaris: Secondary | ICD-10-CM | POA: Diagnosis not present

## 2016-02-08 ENCOUNTER — Other Ambulatory Visit: Payer: Self-pay | Admitting: Internal Medicine

## 2016-02-08 DIAGNOSIS — Z853 Personal history of malignant neoplasm of breast: Secondary | ICD-10-CM

## 2016-03-14 ENCOUNTER — Ambulatory Visit
Admission: RE | Admit: 2016-03-14 | Discharge: 2016-03-14 | Disposition: A | Discharge status: Home / Self Care | Payer: Medicare Other | Source: Ambulatory Visit | Attending: Internal Medicine | Admitting: Internal Medicine

## 2016-03-14 DIAGNOSIS — Z853 Personal history of malignant neoplasm of breast: Secondary | ICD-10-CM

## 2016-03-14 DIAGNOSIS — R928 Other abnormal and inconclusive findings on diagnostic imaging of breast: Secondary | ICD-10-CM | POA: Diagnosis not present

## 2016-03-24 DIAGNOSIS — L661 Lichen planopilaris: Secondary | ICD-10-CM | POA: Diagnosis not present

## 2016-04-05 DIAGNOSIS — M79672 Pain in left foot: Secondary | ICD-10-CM | POA: Diagnosis not present

## 2016-04-05 DIAGNOSIS — Z6823 Body mass index (BMI) 23.0-23.9, adult: Secondary | ICD-10-CM | POA: Diagnosis not present

## 2016-04-19 DIAGNOSIS — M545 Low back pain: Secondary | ICD-10-CM | POA: Diagnosis not present

## 2016-04-19 DIAGNOSIS — R1032 Left lower quadrant pain: Secondary | ICD-10-CM | POA: Insufficient documentation

## 2016-04-19 DIAGNOSIS — M5410 Radiculopathy, site unspecified: Secondary | ICD-10-CM | POA: Diagnosis not present

## 2016-04-19 DIAGNOSIS — Z6823 Body mass index (BMI) 23.0-23.9, adult: Secondary | ICD-10-CM | POA: Diagnosis not present

## 2016-04-19 DIAGNOSIS — F419 Anxiety disorder, unspecified: Secondary | ICD-10-CM | POA: Insufficient documentation

## 2016-04-19 DIAGNOSIS — F418 Other specified anxiety disorders: Secondary | ICD-10-CM | POA: Diagnosis not present

## 2016-04-20 ENCOUNTER — Other Ambulatory Visit: Payer: Self-pay | Admitting: Internal Medicine

## 2016-04-20 ENCOUNTER — Ambulatory Visit (INDEPENDENT_AMBULATORY_CARE_PROVIDER_SITE_OTHER): Payer: Medicare Other | Admitting: Sports Medicine

## 2016-04-20 ENCOUNTER — Encounter: Payer: Self-pay | Admitting: Sports Medicine

## 2016-04-20 DIAGNOSIS — M545 Low back pain: Secondary | ICD-10-CM

## 2016-04-20 DIAGNOSIS — M1612 Unilateral primary osteoarthritis, left hip: Secondary | ICD-10-CM

## 2016-04-20 MED ORDER — TRAMADOL HCL 50 MG PO TABS: 25.0000 mg | ORAL_TABLET | Freq: Two times a day (BID) | ORAL | 0 refills | Status: DC

## 2016-04-20 NOTE — Progress Notes (Signed)
   Subjective:    Patient ID: Tanya Morales , female   DOB: November 13, 1928 , 82 y.o..   MRN: [id number]  HPI  Tanya Morales is here for worsening LLE pain.   Patients states that she started to develop L groin pain "years ago" and over time it has gotten worse. She then began to develop left thigh pain about 7 months go and the pain now extends all the way down her toes. She takes 325 mg ASA daily and states this typically helps her pain. The pain hurts worse with ambulation and is typically relieved with rest.  Tylenol ES does help with more servere pain.  She tried physical therapy in [place] in July 2016 for about 1 month but did not think it helped much. Denies any weakness, numbness, or paresthesias.   Past Medical History: Patient Active Problem List   Diagnosis Date Noted  . Neck pain 11/04/2014  . Midline low back pain without sciatica 11/04/2014  . Breast cancer, stage 1 ([place]) 03/14/2011  . Postop check 03/14/2011    Objective:   BP (!) 152/78   Pulse 65   Ht 5\' 1"  (1.549 m)   Wt 118 lb (53.5 kg)   BMI 22.30 kg/m  Physical Exam  Gen: NAD, alert, cooperative with exam, well-appearing Hips: No swelling or erythema noted. Pelvic alignment unremarkable to inspection and palpation. ROM 60 deg RT but only 30 deg left   Strength: IR: 5/5, ER: 5/5, Flexion: 5/5, Extension: 5/5, Abduction: 5/5, Adduction: 5/5. Greater trochanter without tenderness to palpation. No tenderness over piriformis. Pain with FADIR testing of L leg. Pain with IR lt Negative straight leg test bilaterally No back TTP.    Assessment & Plan:   Hip Pain, left: Likely secondary to osteoarthritic changes in left hip. Will need to rule out avascular necrosis of femoral head. - Xray of hip - Discussed walking 3 x a week - 25 mg Tramadol BID for pain

## 2016-04-21 DIAGNOSIS — M1612 Unilateral primary osteoarthritis, left hip: Secondary | ICD-10-CM | POA: Insufficient documentation

## 2016-04-21 NOTE — Assessment & Plan Note (Signed)
Clinically advanced OA of left hip Trendelenburg is only mild but pain keeps her from walking  Try low dose tramadol bid Use enough pain med to allow her to stay independent in ADLs

## 2016-04-27 ENCOUNTER — Ambulatory Visit
Admission: RE | Admit: 2016-04-27 | Discharge: 2016-04-27 | Disposition: A | Discharge status: Home / Self Care | Payer: Medicare Other | Source: Ambulatory Visit | Attending: Internal Medicine | Admitting: Internal Medicine

## 2016-04-27 ENCOUNTER — Other Ambulatory Visit: Payer: Self-pay | Admitting: Internal Medicine

## 2016-04-27 DIAGNOSIS — M25552 Pain in left hip: Secondary | ICD-10-CM

## 2016-04-27 DIAGNOSIS — M1612 Unilateral primary osteoarthritis, left hip: Secondary | ICD-10-CM | POA: Diagnosis not present

## 2016-05-02 DIAGNOSIS — H0014 Chalazion left upper eyelid: Secondary | ICD-10-CM | POA: Diagnosis not present

## 2016-05-09 DIAGNOSIS — H0014 Chalazion left upper eyelid: Secondary | ICD-10-CM | POA: Diagnosis not present

## 2016-05-24 ENCOUNTER — Ambulatory Visit (INDEPENDENT_AMBULATORY_CARE_PROVIDER_SITE_OTHER): Payer: Medicare Other | Admitting: Sports Medicine

## 2016-05-24 ENCOUNTER — Encounter: Payer: Self-pay | Admitting: Sports Medicine

## 2016-05-24 DIAGNOSIS — M129 Arthropathy, unspecified: Secondary | ICD-10-CM | POA: Diagnosis not present

## 2016-05-24 DIAGNOSIS — M19079 Primary osteoarthritis, unspecified ankle and foot: Secondary | ICD-10-CM

## 2016-05-24 DIAGNOSIS — M1612 Unilateral primary osteoarthritis, left hip: Secondary | ICD-10-CM | POA: Diagnosis not present

## 2016-05-24 NOTE — Progress Notes (Signed)
  CC: here to discuss options for OA of left hip  Patient here primarily to discuss options for her left hip X-rays showed advanced osteoarthritis She states that this does interfere with her ability to live independently Difficult to put on her left shoe Difficult to walk any significant distance because of left groin pain  Very low dose tramadol didn't help her sleep through the night She feels like it makes her drowsy so she does not want to take it during the day She tries Tylenol but gets only mild relief  We also discussed a secondary problem of bilateral foot pain Pain on the left TMT joints 1 and 2 Pain, right fifth ray  Exam (limited) Pleasant older female no acute distress BP (!) 158/60   Ht 5\' 1"  (1.549 m)   Wt 117 lb (53.1 kg)   BMI 22.11 kg/m   Left hip with limited motion Walking gait shows a Trendelenburg  Feet show arthritic change at the TMT joints left Breakdown of the right lateral column

## 2016-05-24 NOTE — Assessment & Plan Note (Signed)
I added a scaphoid pad to left and that seemed to remove pressure  On the right foot I added lateral fifth ray posting in this also gave her some relief  We will try this in all of her shoes

## 2016-05-24 NOTE — Assessment & Plan Note (Signed)
  We spent 30 minutes in face-to-face counseling in regard to options for her arthritis and her limitations of daily life secondary to this  Surgical hip replacement - she is not ready for this and worries about her recovery. She does recall that her mother had 3 hip replacements. She wants to keep this as a last option.  Pain medication with tramadol - she was to continue some of this but she's worried about taking too much because it makes her drowsy. She clearly gets some relief of pain  Option 3 is corticosteroid injection - she thinks she would like to try this to see if she can get some prolonged relief  Plan is to schedule her for an intra-articular corticosteroid injection under ultrasound  Over the short term she will use low-dose tramadol at night so that she can sleep better and tried Tylenol in the mornings

## 2016-05-25 DIAGNOSIS — L661 Lichen planopilaris: Secondary | ICD-10-CM | POA: Diagnosis not present

## 2016-05-31 ENCOUNTER — Ambulatory Visit: Payer: Medicare Other | Admitting: Sports Medicine

## 2016-06-02 ENCOUNTER — Other Ambulatory Visit: Payer: Self-pay

## 2016-06-02 ENCOUNTER — Encounter: Payer: Self-pay | Admitting: Sports Medicine

## 2016-06-02 ENCOUNTER — Ambulatory Visit (INDEPENDENT_AMBULATORY_CARE_PROVIDER_SITE_OTHER): Payer: Medicare Other | Admitting: Sports Medicine

## 2016-06-02 VITALS — BP 148/76

## 2016-06-02 DIAGNOSIS — M1612 Unilateral primary osteoarthritis, left hip: Secondary | ICD-10-CM | POA: Diagnosis present

## 2016-06-02 MED ORDER — METHYLPREDNISOLONE ACETATE 40 MG/ML IJ SUSP
40.0000 mg | Freq: Once | INTRAMUSCULAR | Status: AC
  Administered 2016-06-02: 40 mg via INTRA_ARTICULAR

## 2016-06-02 NOTE — Assessment & Plan Note (Signed)
US guided Injection  Left hip visualized Poor joint space and marked spurring Hip effusion noted  Procedure:  Injection of left hip joint Consent obtained and verified. Time-out conducted. Noted no overlying erythema, induration, or other signs of local infection. Skin prepped in a sterile fashion. Topical analgesic spray: Ethyl chloride. Completed without difficulty. With US guidance directed into the effusion near a large spur.  There was a good steroid flush noted within the joint capsule. Meds: 40 solumedrol and 4cc lidocaine 1% Pain immediately improved suggesting accurate placement of the medication. Advised to call if fevers/chills, erythema, induration, drainage, or persistent bleeding.  Walked out of office w no groin pain.  Advised that this may give relief for a month or longer.  If not would consider THR.

## 2016-06-02 NOTE — Progress Notes (Signed)
  Tanya Morales - 82 y.o. female MRN [id number]  Date of birth: [birth year]  SUBJECTIVE:  Including CC & ROS.  CC: For US guided left hip injection for OA   Location: Left hip Duration: Chronic pain Quality: Aching during day and deep pain at night Severity: gets severe to 8/10 Timing: Most of day and at night  Modifying factors: tramadol helps but makes her feel groggy Associated signs and symptoms: pain that radiates down anterior thigh to knee  ROS: No unexpected weight loss, fever, chills, swelling, instability, muscle pain, numbness/tingling, redness, otherwise see HPI   PMHx - Updated and reviewed.  Contributory factors include: Negative PSHx - Updated and reviewed.  Contributory factors include:  Negative FHx - Updated and reviewed.  Contributory factors include:  Negative Social Hx - Updated and reviewed. Contributory factors include: Negative Medications - reviewed   DATA REVIEWED: Xrays of left hip  PHYSICAL EXAM:  VS: BP:   HR: bpm  TEMP: ( )  RESP:   HT:    WT:   BMI:  PHYSICAL EXAM: Gen: NAD, alert, cooperative with exam, well-appearing HEENT: clear conjunctiva,  CV:  no edema, capillary refill brisk, normal rate Resp: non-labored Skin: no rashes, normal turgor  Neuro: no gross deficits.  Psych:  alert and oriented  Left HIP with very limited ROM Pain with flexion Pain with full leg extension Pain with adductiona nd abduction  ASSESSMENT & PLAN:   No problem-specific Assessment & Plan notes found for this encounter.

## 2016-06-22 DIAGNOSIS — Z23 Encounter for immunization: Secondary | ICD-10-CM | POA: Diagnosis not present

## 2016-07-05 ENCOUNTER — Ambulatory Visit (INDEPENDENT_AMBULATORY_CARE_PROVIDER_SITE_OTHER): Payer: Medicare Other | Admitting: Sports Medicine

## 2016-07-05 ENCOUNTER — Encounter: Payer: Self-pay | Admitting: Sports Medicine

## 2016-07-05 DIAGNOSIS — M1612 Unilateral primary osteoarthritis, left hip: Secondary | ICD-10-CM

## 2016-07-05 MED ORDER — TRAMADOL HCL 50 MG PO TABS: 50.0000 mg | ORAL_TABLET | Freq: Every evening | ORAL | 3 refills | Status: DC | PRN

## 2016-07-05 NOTE — Assessment & Plan Note (Signed)
Do normal activity Try 1 tramadol at night Use cane for walking  Improved and we will reck in 2 mos Can consider repeat CSI if needed

## 2016-07-05 NOTE — Progress Notes (Signed)
CC: Left hip OA  Now 1 month post CSI of left hip First 10 days not much relief One tramadol at night would help (makes her sleepy in day)  By 2 weeks less frequent pain Pain not as sharp Walking now with cane and feels pretty steady  Soc: Lives alone Drives  Fam Son with [employer]  ROS Minimal pain in RT hip unless standing too long Both knees feel stable  PE Pleasanat older F in NAD BP 138/76   Ht 5\' 1"  (1.549 m)   Wt 116 lb (52.6 kg)   BMI 21.92 kg/m   Left Hip flexion is improved but about 10 deg less than RT ER improved on RT to 25 deg vs 35 RT IR limited to 5 to 10 deg and causes mild pain (25 deg on RT)  Walking gait Much less antalgic Trendelenburg is resolved Stable with cane even on fast walk

## 2016-07-11 ENCOUNTER — Ambulatory Visit (HOSPITAL_COMMUNITY): Admission: RE | Admit: 2016-07-11 | Payer: Medicare Other | Source: Ambulatory Visit

## 2016-07-11 ENCOUNTER — Other Ambulatory Visit (HOSPITAL_COMMUNITY): Payer: Self-pay | Admitting: Internal Medicine

## 2016-07-18 ENCOUNTER — Encounter (HOSPITAL_COMMUNITY): Payer: Self-pay

## 2016-07-18 ENCOUNTER — Ambulatory Visit (HOSPITAL_COMMUNITY)
Admission: RE | Admit: 2016-07-18 | Discharge: 2016-07-18 | Disposition: A | Discharge status: Home / Self Care | Payer: Medicare Other | Source: Ambulatory Visit | Attending: Internal Medicine | Admitting: Internal Medicine

## 2016-07-18 DIAGNOSIS — M810 Age-related osteoporosis without current pathological fracture: Secondary | ICD-10-CM | POA: Diagnosis not present

## 2016-07-18 MED ORDER — DENOSUMAB 60 MG/ML ~~LOC~~ SOLN
60.0000 mg | Freq: Once | SUBCUTANEOUS | Status: AC
  Administered 2016-07-18: 60 mg via SUBCUTANEOUS
  Filled 2016-07-18: qty 1

## 2016-07-18 NOTE — Discharge Instructions (Signed)
Denosumab injection (prolia) What is this medicine? DENOSUMAB (den oh sue mab) slows bone breakdown. Prolia is used to treat osteoporosis in women after menopause and in men. Delton See is used to prevent bone fractures and other bone problems caused by cancer bone metastases. Delton See is also used to treat giant cell tumor of the bone. This medicine may be used for other purposes; ask your health care provider or pharmacist if you have questions. What should I tell my health care provider before I take this medicine? They need to know if you have any of these conditions: -dental disease -eczema -infection or history of infections -kidney disease or on dialysis -low blood calcium or vitamin D -malabsorption syndrome -scheduled to have surgery or tooth extraction -taking medicine that contains denosumab -thyroid or parathyroid disease -an unusual reaction to denosumab, other medicines, foods, dyes, or preservatives -pregnant or trying to get pregnant -breast-feeding How should I use this medicine? This medicine is for injection under the skin. It is given by a health care professional in a hospital or clinic setting. If you are getting Prolia, a special MedGuide will be given to you by the pharmacist with each prescription and refill. Be sure to read this information carefully each time. For Prolia, talk to your pediatrician regarding the use of this medicine in children. Special care may be needed. For Delton See, talk to your pediatrician regarding the use of this medicine in children. While this drug may be prescribed for children as young as 13 years for selected conditions, precautions do apply. Overdosage: If you think you have taken too much of this medicine contact a poison control center or emergency room at once. NOTE: This medicine is only for you. Do not share this medicine with others. What if I miss a dose? It is important not to miss your dose. Call your doctor or health care professional  if you are unable to keep an appointment. What may interact with this medicine? Do not take this medicine with any of the following medications: -other medicines containing denosumab This medicine may also interact with the following medications: -medicines that suppress the immune system -medicines that treat cancer -steroid medicines like prednisone or cortisone This list may not describe all possible interactions. Give your health care provider a list of all the medicines, herbs, non-prescription drugs, or dietary supplements you use. Also tell them if you smoke, drink alcohol, or use illegal drugs. Some items may interact with your medicine. What should I watch for while using this medicine? Visit your doctor or health care professional for regular checks on your progress. Your doctor or health care professional may order blood tests and other tests to see how you are doing. Call your doctor or health care professional if you get a cold or other infection while receiving this medicine. Do not treat yourself. This medicine may decrease your body's ability to fight infection. You should make sure you get enough calcium and vitamin D while you are taking this medicine, unless your doctor tells you not to. Discuss the foods you eat and the vitamins you take with your health care professional. See your dentist regularly. Brush and floss your teeth as directed. Before you have any dental work done, tell your dentist you are receiving this medicine. Do not become pregnant while taking this medicine or for 5 months after stopping it. Women should inform their doctor if they wish to become pregnant or think they might be pregnant. There is a potential for serious side  effects to an unborn child. Talk to your health care professional or pharmacist for more information. °What side effects may I notice from receiving this medicine? °Side effects that you should report to your doctor or health care professional  as soon as possible: °-allergic reactions like skin rash, itching or hives, swelling of the face, lips, or tongue °-breathing problems °-chest pain °-fast, irregular heartbeat °-feeling faint or lightheaded, falls °-fever, chills, or any other sign of infection °-muscle spasms, tightening, or twitches °-numbness or tingling °-skin blisters or bumps, or is dry, peels, or red °-slow healing or unexplained pain in the mouth or jaw °-unusual bleeding or bruising °Side effects that usually do not require medical attention (Report these to your doctor or health care professional if they continue or are bothersome.): °-muscle pain °-stomach upset, gas °This list may not describe all possible side effects. Call your doctor for medical advice about side effects. You may report side effects to FDA at 1-800-FDA-1088. °Where should I keep my medicine? °This medicine is only given in a clinic, doctor's office, or other health care setting and will not be stored at home. °NOTE: This sheet is a summary. It may not cover all possible information. If you have questions about this medicine, talk to your doctor, pharmacist, or health care provider. °  °© 2016, Elsevier/Gold Standard. (2012-03-05 12:37:47) ° °

## 2016-07-27 DIAGNOSIS — H01005 Unspecified blepharitis left lower eyelid: Secondary | ICD-10-CM | POA: Diagnosis not present

## 2016-07-27 DIAGNOSIS — H01004 Unspecified blepharitis left upper eyelid: Secondary | ICD-10-CM | POA: Diagnosis not present

## 2016-08-17 DIAGNOSIS — M1990 Unspecified osteoarthritis, unspecified site: Secondary | ICD-10-CM | POA: Insufficient documentation

## 2016-08-26 DIAGNOSIS — L661 Lichen planopilaris: Secondary | ICD-10-CM | POA: Diagnosis not present

## 2016-08-30 ENCOUNTER — Ambulatory Visit (INDEPENDENT_AMBULATORY_CARE_PROVIDER_SITE_OTHER): Payer: Medicare Other | Admitting: Sports Medicine

## 2016-08-30 DIAGNOSIS — M1612 Unilateral primary osteoarthritis, left hip: Secondary | ICD-10-CM | POA: Diagnosis not present

## 2016-08-30 NOTE — Patient Instructions (Signed)
You have osteoarthritis of your left hip. -Increase your tramadol to 2x/day. Take one pill in the morning. Wait 2-3 hours before driving after taking the medication. Take another pill at night before bed. -Keep your scheduled appointment with Dr. Trevor Mace office for surgery evaluation. -Get Ensure or Boost nutrition drink for appetite.

## 2016-08-30 NOTE — Assessment & Plan Note (Signed)
We suggested going ahead with surgical consultation  I think she is stable for this surgery

## 2016-08-30 NOTE — Progress Notes (Signed)
  Tanya Morales - 82 y.o. female MRN [id number]  Date of birth: 1929/08/31  SUBJECTIVE:  Including CC & ROS.  CC: left hip f/u [name] is a 82yo female w/ hx of left hip OA presenting for left hip pain follow up. Pt reports that since last being seen she has not had much improvement in her pain. She states the pain is constant. Walking aggravates the pain. Pt received a corticosteroid injection in September for her left hip but symptoms only improved for 1.5 months. Pt has used tramadol qhs with mild relief of pain. She reports that she takes asa 325mg  daily with mild relief of pain. She reports that she was given a referral from her PCP for orthopedic evaluation at Dr. Trevor Mace office and has a scheduled appt for 09/07/16 with his PA.  The pain is limited her activity and she reports that she is starting to feel down because she is use to being active. She also reports decreased appetite due to pain. Pt currently uses cane for assistance.    ROS: numbness in left leg to knee, no fever, no chills, no incontinence, no night sweats  HISTORY: Past Medical, Surgical, Social, and Family History Reviewed & Updated per EMR.   Pertinent Historical Findings include: PMSHx -  Hypothyroidism, OA of left hip PSHx -  Widow, lives alone  FHx -  Son has guillaine barre  Medications - tramadol 50mg , simvastatin 20mg , olmesartan 20mg , levothyroxine 54mcg, asa 325mg , calcium-mag 500-250mg , amlodipine 5mg , polia 60mg    PHYSICAL EXAM:  VS: BP:132/60  HR: bpm  TEMP: ( )  RESP:   HT:    WT:   BMI:  PHYSICAL EXAM: Gen: NAD, alert, cooperative with exam, well-appearing Left Hip: ROM limited with IR: 10-15 Deg, ER: 25 Deg,  Hip flexion limited compared to right  No TTP of Greater trochanter or piriformis   ROM of right hip  IR: 30 deg and ER 30 deg Gait stable with cane.   ASSESSMENT & PLAN:  Left hip OA; Unchanged. Not sufficient relief of symptoms with conservative management.  -Will send  message to Dr. Trevor Mace office. Encouraged patient to keep her appt with Dr. Trevor Mace office for surgery evaluation.  -Increase Tramadol from qhs to BID. Discussed that patient should wait to drive 2-3 hours after taking medication. Pt understands and agrees.  -Discussed patient using ensure or boost for increased calorie intake.

## 2016-08-31 ENCOUNTER — Ambulatory Visit (INDEPENDENT_AMBULATORY_CARE_PROVIDER_SITE_OTHER): Payer: Self-pay | Admitting: Orthopaedic Surgery

## 2016-09-06 ENCOUNTER — Ambulatory Visit: Payer: Medicare Other | Admitting: Sports Medicine

## 2016-09-06 DIAGNOSIS — C50919 Malignant neoplasm of unspecified site of unspecified female breast: Secondary | ICD-10-CM | POA: Diagnosis not present

## 2016-09-06 DIAGNOSIS — M1990 Unspecified osteoarthritis, unspecified site: Secondary | ICD-10-CM | POA: Diagnosis not present

## 2016-09-06 DIAGNOSIS — Z6822 Body mass index (BMI) 22.0-22.9, adult: Secondary | ICD-10-CM | POA: Diagnosis not present

## 2016-09-06 DIAGNOSIS — E038 Other specified hypothyroidism: Secondary | ICD-10-CM | POA: Diagnosis not present

## 2016-09-06 DIAGNOSIS — R079 Chest pain, unspecified: Secondary | ICD-10-CM | POA: Diagnosis not present

## 2016-09-06 DIAGNOSIS — Z1389 Encounter for screening for other disorder: Secondary | ICD-10-CM | POA: Diagnosis not present

## 2016-09-06 DIAGNOSIS — E784 Other hyperlipidemia: Secondary | ICD-10-CM | POA: Diagnosis not present

## 2016-09-06 DIAGNOSIS — I10 Essential (primary) hypertension: Secondary | ICD-10-CM | POA: Diagnosis not present

## 2016-09-06 DIAGNOSIS — M859 Disorder of bone density and structure, unspecified: Secondary | ICD-10-CM | POA: Diagnosis not present

## 2016-09-07 ENCOUNTER — Encounter (INDEPENDENT_AMBULATORY_CARE_PROVIDER_SITE_OTHER): Payer: Self-pay | Admitting: Physician Assistant

## 2016-09-07 ENCOUNTER — Ambulatory Visit (INDEPENDENT_AMBULATORY_CARE_PROVIDER_SITE_OTHER): Payer: Medicare Other | Admitting: Physician Assistant

## 2016-09-07 ENCOUNTER — Encounter (INDEPENDENT_AMBULATORY_CARE_PROVIDER_SITE_OTHER): Payer: Self-pay

## 2016-09-07 VITALS — Ht 61.0 in | Wt 114.0 lb

## 2016-09-07 DIAGNOSIS — M1612 Unilateral primary osteoarthritis, left hip: Secondary | ICD-10-CM

## 2016-09-07 NOTE — Progress Notes (Signed)
Office Visit Note   Patient: Tanya Morales           Date of Birth: 01-Dec-1928           MRN: [id number] Visit Date: 09/07/2016              Requested by: Crist Infante, MD [address] [place], [place] [address] PCP: Jerlyn Ly, MD   Assessment & Plan: Visit Diagnoses:  1. Primary osteoarthritis of left hip     Plan: [name] states she would like to proceed with a left total hip arthroplasty in the near future. Questions were encouraged and answered by Dr. Ninfa Linden itself. Risks benefits reviewed with the patient. Postoperative protocol reviewed with patient. Literature on anterior hip arthroplasty was given to the patient by Dr. Ninfa Linden also the procedure was reviewed with the patient by Dr. Ninfa Linden using a hip model.We will need a cardiac evaluation due to her recent chest pain and cardiac clearance prior surgery. Once cardiac clearance is obtained we'll get her on the OR schedule for left total hip arthroplasty have her follow up 2 weeks postop.  Follow-Up Instructions: Return in about 2 weeks (around 09/21/2016) for post op after surgery.   Orders:  No orders of the defined types were placed in this encounter.  No orders of the defined types were placed in this encounter.     Procedures: No procedures performed   Clinical Data: No additional findings.   Subjective: Chief Complaint  Patient presents with  . Left Hip - Pain    HPI 82 year old female comes in today with left hip pain that's been ongoing for over a year now. She is not having constant pain in the hip and actually using a cane for the last 2 months due to the fact that it feels like it may give way. She's also had an intra-articular injection in September in the hip that gave her some relief but not much for about a month. Pain in the hip does awaken her. She's taken tramadol at night so use in the full strength aspirin give her some mild relief. Has difficulty with donning shoes socks on the  left side due to decreased range of motion pain. Review of Systems Chest pain/discomfort otherwise negative  Objective: Vital Signs: Ht 5\' 1"  (1.549 m)   Wt 114 lb (51.7 kg)   BMI 21.54 kg/m   Physical Exam  Constitutional: [name], place, and time. She appears well-developed and well-nourished. No distress.  Cardiovascular: Intact distal pulses.   Pulmonary/Chest: Effort normal.  Neurological: She is alert and oriented to person, place, and time.  Psychiatric: She has a normal mood and affect.    Ortho Exam Good range of motion of the right hip without pain and limited left hip motion. She has pain with internal and external rotation of left hip. Has slight knee flexion contracture on the left. Pulling down on the left leg her leg lengths appeared to be equal. Calves are supple and nontender. Dorsiflexion plantar flexion of the ankles are intact. Specialty Comments:  No specialty comments available.  Imaging: No results found. Hemipelvis films AP and lateral views left hip dated 04/27/2016 shows significant degenerative joint disease of the left hip bone-on-bone superior joint space. No acute fractures.  PMFS History: Patient Active Problem List   Diagnosis Date Noted  . Arthritis of midfoot 05/24/2016  . Osteoarthritis of left hip 04/21/2016  . Neck pain 11/04/2014  . Midline low back pain  without sciatica 11/04/2014  . Breast cancer, stage 1 ([facility]) 03/14/2011  . Postop check 03/14/2011   Past Medical History:  Diagnosis Date  . Arthritis   . Breast cancer, stage 1 ([facility]) 03/14/2011   left  . Hyperlipidemia   . Hypertension   . Hypothyroidism   . Pain    arthritis, left hip  . TIA (transient ischemic attack) march 2012    Family History  Problem Relation Age of Onset  . Heart disease Mother   . Cancer Maternal Grandmother     Past Surgical History:  Procedure Laterality Date  . BREAST SURGERY Left 02/23/11   left lumpectomy  . FINGER SURGERY      . TONSILLECTOMY AND ADENOIDECTOMY    . TOTAL ABDOMINAL HYSTERECTOMY     Social History   Occupational History  . Not on file.   Social History Main Topics  . Smoking status: Former Smoker    Quit date: 09/20/1967  . Smokeless tobacco: Never Used     Comment: quit 1969  . Alcohol use No  . Drug use: No  . Sexual activity: Not on file

## 2016-09-07 NOTE — Progress Notes (Deleted)
   Office Visit Note   Patient: Tanya Morales           Date of Birth: [birth year]           MRN: [id number] Visit Date: 09/07/2016              Requested by: Crist Infante, MD [address] [place], [place] [address] PCP: Jerlyn Ly, MD   Assessment & Plan: Visit Diagnoses: No diagnosis found.  Plan: ***  Follow-Up Instructions: No Follow-up on file.   Orders:  No orders of the defined types were placed in this encounter.  No orders of the defined types were placed in this encounter.     Procedures: No procedures performed   Clinical Data: No additional findings.   Subjective: Chief Complaint  Patient presents with  . Left Hip - Pain    Patient is here with left hip pain. She states that she is ready to schedule surgery. She has had x-rays made at [facility]. She has difficulty ambulating. She takes Tramadol as needed for pain. She states that it does help some.     Review of Systems   Objective: Vital Signs: There were no vitals taken for this visit.  Physical Exam  Ortho Exam  Specialty Comments:  No specialty comments available.  Imaging: No results found.   PMFS History: Patient Active Problem List   Diagnosis Date Noted  . Arthritis of midfoot 05/24/2016  . Osteoarthritis of left hip 04/21/2016  . Neck pain 11/04/2014  . Midline low back pain without sciatica 11/04/2014  . Breast cancer, stage 1 ([facility]) 03/14/2011  . Postop check 03/14/2011   Past Medical History:  Diagnosis Date  . Arthritis   . Breast cancer, stage 1 ([facility]) 03/14/2011   left  . Hyperlipidemia   . Hypertension   . Hypothyroidism   . Pain    arthritis, left hip  . TIA (transient ischemic attack) march 2012    Family History  Problem Relation Age of Onset  . Heart disease Mother   . Cancer Maternal Grandmother     Past Surgical History:  Procedure Laterality Date  . BREAST SURGERY Left 02/23/11   left lumpectomy  . FINGER SURGERY    . TONSILLECTOMY  AND ADENOIDECTOMY    . TOTAL ABDOMINAL HYSTERECTOMY     Social History   Occupational History  . Not on file.   Social History Main Topics  . Smoking status: Former Smoker    Quit date: 09/20/1967  . Smokeless tobacco: Never Used     Comment: quit 1969  . Alcohol use No  . Drug use: No  . Sexual activity: Not on file

## 2016-09-13 NOTE — Progress Notes (Signed)
Cardiology Office Note2   Date:  09/21/2016   ID:  Tanya Morales, DOB 07/16/1929, MRN [id number]  PCP:  Jerlyn Ly, MD  Cardiologist:   Jenkins Rouge, MD   No chief complaint on file.     History of Present Illness: Tanya Morales is a 82 y.o. female who presents for evaluation of chest pain.  CRF;s HTN and elevated lipids She Is preop for left hip surgery with Dr Ninfa Linden [place] notes by Dr Joylene Draft about chest pain with "red face" at night when She goes to bed Takes tramadol for hip pain at night   Labs from 09/15/16 reviewed K 4.7 Cr .7 LDL 97 PLT 307 Hct 44.4   She reports over last few months getter pain in center of chest when trying To get ready for bed.  Stressful due to the pian in her hip and takes her a long Time Pain goes away when she is in the bed and not stressed No associated dyspnea palpitations or diaphoresis   Past Medical History:  Diagnosis Date  . Arthritis   . Breast cancer, stage 1 ([facility]) 03/14/2011   left  . Hyperlipidemia   . Hypertension   . Hypothyroidism   . Pain    arthritis, left hip  . TIA (transient ischemic attack) march 2012    Past Surgical History:  Procedure Laterality Date  . BREAST SURGERY Left 02/23/11   left lumpectomy  . FINGER SURGERY    . TONSILLECTOMY AND ADENOIDECTOMY    . TOTAL ABDOMINAL HYSTERECTOMY       Current Outpatient Prescriptions  Medication Sig Dispense Refill  . amLODipine (NORVASC) 5 MG tablet Take 5 mg by mouth daily.      Marland Kitchen aspirin 325 MG tablet Take 325 mg by mouth daily.      . Calcium-Magnesium (CAL-MAG) 500-250 MG TABS Take by mouth.      . cholecalciferol (VITAMIN D) 400 UNITS TABS tablet Take 400 Units by mouth.    . clobetasol (TEMOVATE) 0.05 % GEL Apply 1 application topically daily.    Marland Kitchen co-enzyme Q-10 50 MG capsule Take 200 mg by mouth daily.    Marland Kitchen denosumab (PROLIA) 60 MG/ML SOLN injection Inject 60 mg into the skin every 6 (six) months. Administer in upper arm, thigh, or abdomen    .  FLUAD 0.5 ML SUSY inject 0.5 milliliter intramuscularly  0  . levothyroxine (SYNTHROID, LEVOTHROID) 50 MCG tablet Take 50 mcg by mouth daily.      . Multiple Vitamin (MULTIVITAMIN) capsule Take 1 capsule by mouth daily.      Marland Kitchen olmesartan (BENICAR) 20 MG tablet Take 20 mg by mouth daily.      . simvastatin (ZOCOR) 20 MG tablet Take 10 mg by mouth at bedtime.     . traMADol (ULTRAM) 50 MG tablet Take 50 mg by mouth 4 (four) times daily.    . vitamin C (ASCORBIC ACID) 500 MG tablet Take 500 mg by mouth daily.       No current facility-administered medications for this visit.     Allergies:   Celebrex [celecoxib] and Epinephrine    Social History:  The patient  reports that she quit smoking about 49 years ago. She has never used smokeless tobacco. She reports that she does not drink alcohol or use drugs.   Family History:  The patient's family history includes Cancer in her maternal grandmother; Congestive Heart Failure in her mother; Healthy in her sister and son; Other in  her son; Rheum arthritis in her sister.    ROS:  Please see the history of present illness.   Otherwise, review of systems are positive for none.   All other systems are reviewed and negative.    PHYSICAL EXAM: VS:  BP 124/62   Pulse 76   Ht 5\' 1"  (1.549 m)   Wt 118 lb (53.5 kg)   BMI 22.30 kg/m  , BMI Body mass index is 22.3 kg/m. Affect appropriate Healthy:  appears stated age 45: normal Neck supple with no adenopathy JVP normal no bruits no thyromegaly Lungs clear with no wheezing and good diaphragmatic motion Heart:  S1/S2 AV sclerosis murmur, no rub, gallop or click PMI normal Abdomen: benighn, BS positve, no tenderness, no AAA no bruit.  No HSM or HJR Distal pulses intact with no bruits No edema Neuro non-focal Skin warm and dry No muscular weakness    EKG:  2012 ST PVC LBBB  08/17/11 SR rate 65 LBBB    Recent Labs: No results found for requested labs within last 8760 hours.    Lipid  Panel    Component Value Date/Time   CHOL  12/13/2010 0505    148        ATP III CLASSIFICATION:  <200     mg/dL   Desirable  200-239  mg/dL   Borderline High  >=240    mg/dL   High          TRIG 75 12/13/2010 0505   HDL 65 12/13/2010 0505   CHOLHDL 2.3 12/13/2010 0505   VLDL 15 12/13/2010 0505   LDLCALC  12/13/2010 0505    68        Total Cholesterol/HDL:CHD Risk Coronary Heart Disease Risk Table                     Men   Women  1/2 Average Risk   3.4   3.3  Average Risk       5.0   4.4  2 X Average Risk   9.6   7.1  3 X Average Risk  23.4   11.0        Use the calculated Patient Ratio above and the CHD Risk Table to determine the patient's CHD Risk.        ATP III CLASSIFICATION (LDL):  <100     mg/dL   Optimal  100-129  mg/dL   Near or Above                    Optimal  130-159  mg/dL   Borderline  160-189  mg/dL   High  >190     mg/dL   Very High      Wt Readings from Last 3 Encounters:  09/21/16 118 lb (53.5 kg)  09/07/16 114 lb (51.7 kg)  07/18/16 120 lb 6.4 oz (54.6 kg)      Other studies Reviewed: Additional studies/ records that were reviewed today include: Notes Dr Otilio Carpen and notes Dr Ninfa Linden [facility] [place] on [address].    ASSESSMENT AND PLAN:  1.  Preop: give age , moderate risk surgery abnormal ECG and symptoms  Will order lexiscan myovue 2. Chest pain see above ECG unrevealing due to LBBB 3. LBBB Chronic no high grade AV block yearly ECG 4. HTN Well controlled.  Continue current medications and low sodium Dash type diet.   5. Thyroid  On replacement labs with primary  6. Murmur: AV sclerosis doubt stenosis  f/u Echo   Current medicines are reviewed at length with the patient today.  The patient does not have concerns regarding medicines.  The following changes have been made:  no change  Labs/ tests ordered today include: Echo Lexiscan myovue   Orders Placed This Encounter  Procedures  . Myocardial Perfusion Imaging  . EKG 12-Lead  .  ECHOCARDIOGRAM COMPLETE     Disposition:   FU with me in 6 months      Signed, Jenkins Rouge, MD  09/21/2016 3:13 PM    [facility] [place], [place], [place]  [address] Phone: [phone]; Fax: [phone]

## 2016-09-21 ENCOUNTER — Encounter: Payer: Self-pay | Admitting: *Deleted

## 2016-09-21 ENCOUNTER — Ambulatory Visit (INDEPENDENT_AMBULATORY_CARE_PROVIDER_SITE_OTHER): Payer: Medicare Other | Admitting: Cardiovascular Disease

## 2016-09-21 VITALS — BP 124/62 | HR 76 | Ht 61.0 in | Wt 118.0 lb

## 2016-09-21 DIAGNOSIS — Z01818 Encounter for other preprocedural examination: Secondary | ICD-10-CM | POA: Diagnosis not present

## 2016-09-21 DIAGNOSIS — I447 Left bundle-branch block, unspecified: Secondary | ICD-10-CM | POA: Diagnosis not present

## 2016-09-21 DIAGNOSIS — R011 Cardiac murmur, unspecified: Secondary | ICD-10-CM

## 2016-09-21 DIAGNOSIS — R079 Chest pain, unspecified: Secondary | ICD-10-CM

## 2016-09-21 NOTE — Patient Instructions (Addendum)
Medication Instructions:  Your physician recommends that you continue on your current medications as directed. Please refer to the Current Medication list given to you today.  Labwork: NONE  Testing/Procedures: Your physician has requested that you have an echocardiogram ASAP. Echocardiography is a painless test that uses sound waves to create images of your heart. It provides your doctor with information about the size and shape of your heart and how well your heart's chambers and valves are working. This procedure takes approximately one hour. There are no restrictions for this procedure.  Your physician has requested that you have a lexiscan myoview ASAP. For further information please visit [email or web address]. Please follow instruction sheet, as given.  Follow-Up: Your physician wants you to follow-up in: 6 months with Dr. Johnsie Cancel. You will receive a reminder letter in the mail two months in advance. If you don't receive a letter, please call our office to schedule the follow-up appointment.   If you need a refill on your cardiac medications before your next appointment, please call your pharmacy.

## 2016-09-27 NOTE — Progress Notes (Signed)
Scheduling pre op- please place SURGICAL ORDERS IN EPIC  Thanks 

## 2016-09-28 ENCOUNTER — Encounter: Payer: Self-pay | Admitting: Cardiovascular Disease

## 2016-09-29 ENCOUNTER — Telehealth (HOSPITAL_COMMUNITY): Payer: Self-pay | Admitting: *Deleted

## 2016-09-29 NOTE — Telephone Encounter (Signed)
Patient given detailed instructions per Myocardial Perfusion Study Information Sheet for the test on 10/03/16. Patient notified to arrive 15 minutes early and that it is imperative to arrive on time for appointment to keep from having the test rescheduled.  If you need to cancel or reschedule your appointment, please call the office within 24 hours of your appointment. Failure to do so may result in a cancellation of your appointment, and a $50 no show fee. Patient verbalized understanding. Raguel Kosloski Jacqueline    

## 2016-09-30 ENCOUNTER — Inpatient Hospital Stay (HOSPITAL_COMMUNITY): Admission: RE | Admit: 2016-09-30 | Payer: Medicare Other | Source: Ambulatory Visit

## 2016-09-30 ENCOUNTER — Other Ambulatory Visit (INDEPENDENT_AMBULATORY_CARE_PROVIDER_SITE_OTHER): Payer: Self-pay | Admitting: Physician Assistant

## 2016-09-30 NOTE — Progress Notes (Signed)
HAS PRE OP 1/17-  PLEASE ADD SURGICAL ORDERS IN EPIC  THANKS

## 2016-10-03 ENCOUNTER — Other Ambulatory Visit (HOSPITAL_COMMUNITY): Payer: Self-pay | Admitting: *Deleted

## 2016-10-03 ENCOUNTER — Other Ambulatory Visit (HOSPITAL_COMMUNITY): Payer: Self-pay | Admitting: Emergency Medicine

## 2016-10-03 NOTE — Patient Instructions (Signed)
Tanya Morales  10/03/2016   Your procedure is scheduled on: 10/07/2016  Report to [facility] Main  Entrance take [facility]  elevators to 3rd floor to  [place] at 1230PM.  Call this number if you have problems the morning of surgery [phone]   Remember: ONLY 1 PERSON MAY GO WITH YOU TO SHORT STAY TO GET  READY MORNING OF [name].  Do not eat food After Midnight. You may have clear liquids (see list below) from midnight to 0830am day of surgery 10/07/2016. Nothing by mouth after 0830am!     Take these medicines the morning of surgery with A SIP OF WATER: levothyroxine(Synthroid), simvastatin(Zocor)                                You may not have any metal on your body including hair pins and              piercings  Do not wear jewelry, make-up, lotions, powders or perfumes, deodorant             Do not wear nail polish.  Do not shave  48 hours prior to surgery.              Men may shave face and neck.   Do not bring valuables to the hospital. [place].  Contacts, dentures or bridgework may not be worn into surgery.  Leave suitcase in the car. After surgery it may be brought to your room.                Please read over the following fact sheets you were given: _____________________________________________________________________                CLEAR LIQUID DIET   Foods Allowed                                                                     Foods Excluded  Coffee and tea, regular and decaf                             liquids that you cannot  Plain Jell-O in any flavor                                             see through such as: Fruit ices (not with fruit pulp)                                     milk, soups, orange juice  Iced Popsicles                                    All solid food Carbonated  beverages, regular and diet                                    Cranberry, grape  and apple juices Sports drinks like Gatorade Lightly seasoned clear broth or consume(fat free) Sugar, honey syrup  Sample Menu Breakfast                                Lunch                                     Supper Cranberry juice                    Beef broth                            Chicken broth Jell-O                                     Grape juice                           Apple juice Coffee or tea                        Jell-O                                      Popsicle                                                Coffee or tea                        Coffee or tea  _____________________________________________________________________  [facility] - Preparing for Surgery Before surgery, you can play an important role.  Because skin is not sterile, your skin needs to be as free of germs as possible.  You can reduce the number of germs on your skin by washing with CHG (chlorahexidine gluconate) soap before surgery.  CHG is an antiseptic cleaner which kills germs and bonds with the skin to continue killing germs even after washing. Please DO NOT use if you have an allergy to CHG or antibacterial soaps.  If your skin becomes reddened/irritated stop using the CHG and inform your nurse when you arrive at Short Stay. Do not shave (including legs and underarms) for at least 48 hours prior to the first CHG shower.  You may shave your face/neck. Please follow these instructions carefully:  1.  Shower with CHG Soap the night before surgery and the  morning of Surgery.  2.  If you choose to wash your hair, wash your hair first as usual with your  normal  shampoo.  3.  After you shampoo, rinse your hair and body thoroughly to remove the  shampoo.  4.  Use CHG as you would any other liquid soap.  You can apply chg directly  to the skin and wash                       Gently with a scrungie or clean washcloth.  5.  Apply the CHG Soap to your body ONLY FROM THE NECK DOWN.    Do not use on face/ open                           Wound or open sores. Avoid contact with eyes, ears mouth and genitals (private parts).                       Wash face,  Genitals (private parts) with your normal soap.             6.  Wash thoroughly, paying special attention to the area where your surgery  will be performed.  7.  Thoroughly rinse your body with warm water from the neck down.  8.  DO NOT shower/wash with your normal soap after using and rinsing off  the CHG Soap.                9.  Pat yourself dry with a clean towel.            10.  Wear clean pajamas.            11.  Place clean sheets on your bed the night of your first shower and do not  sleep with pets. Day of Surgery : Do not apply any lotions/deodorants the morning of surgery.  Please wear clean clothes to the hospital/surgery center.  FAILURE TO FOLLOW THESE INSTRUCTIONS MAY RESULT IN THE CANCELLATION OF YOUR SURGERY PATIENT SIGNATURE_________________________________  NURSE SIGNATURE__________________________________  ________________________________________________________________________   Adam Phenix  An incentive spirometer is a tool that can help keep your lungs clear and active. This tool measures how well you are filling your lungs with each breath. Taking long deep breaths may help reverse or decrease the chance of developing breathing (pulmonary) problems (especially infection) following:  A long period of time when you are unable to move or be active. BEFORE THE PROCEDURE   If the spirometer includes an indicator to show your best effort, your nurse or respiratory therapist will set it to a desired goal.  If possible, sit up straight or lean slightly forward. Try not to slouch.  Hold the incentive spirometer in an upright position. INSTRUCTIONS FOR USE  1. Sit on the edge of your bed if possible, or sit up as far as you can in bed or on a chair. 2. Hold the incentive spirometer in an  upright position. 3. Breathe out normally. 4. Place the mouthpiece in your mouth and seal your lips tightly around it. 5. Breathe in slowly and as deeply as possible, raising the piston or the ball toward the top of the column. 6. Hold your breath for 3-5 seconds or for as long as possible. Allow the piston or ball to fall to the bottom of the column. 7. Remove the mouthpiece from your mouth and breathe out normally. 8. Rest for a few seconds and repeat Steps 1 through 7 at least 10 times every 1-2 hours when you are awake. Take your time and take a few normal breaths between deep breaths. 9. The spirometer may include an indicator to  show your best effort. Use the indicator as a goal to work toward during each repetition. 10. After each set of 10 deep breaths, practice coughing to be sure your lungs are clear. If you have an incision (the cut made at the time of surgery), support your incision when coughing by placing a pillow or rolled up towels firmly against it. Once you are able to get out of bed, walk around indoors and cough well. You may stop using the incentive spirometer when instructed by your caregiver.  RISKS AND COMPLICATIONS  Take your time so you do not get dizzy or light-headed.  If you are in pain, you may need to take or ask for pain medication before doing incentive spirometry. It is harder to take a deep breath if you are having pain. AFTER USE  Rest and breathe slowly and easily.  It can be helpful to keep track of a log of your progress. Your caregiver can provide you with a simple table to help with this. If you are using the spirometer at home, follow these instructions: [place] IF:   You are having difficultly using the spirometer.  You have trouble using the spirometer as often as instructed.  Your pain medication is not giving enough relief while using the spirometer.  You develop fever of 100.5 F (38.1 C) or higher. SEEK IMMEDIATE MEDICAL CARE  IF:   You cough up bloody sputum that had not been present before.  You develop fever of 102 F (38.9 C) or greater.  You develop worsening pain at or near the incision site. MAKE SURE YOU:   Understand these instructions.  Will watch your condition.  Will get help right away if you are not doing well or get worse. Document Released: 01/16/2007 Document Revised: 11/28/2011 Document Reviewed: 03/19/2007 [employer] Patient Information 2014 [employer], [place].   ________________________________________________________________________  WHAT IS A BLOOD TRANSFUSION? Blood Transfusion Information  A transfusion is the replacement of blood or some of its parts. Blood is made up of multiple cells which provide different functions.  Red blood cells carry oxygen and are used for blood loss replacement.  White blood cells fight against infection.  Platelets control bleeding.  Plasma helps clot blood.  Other blood products are available for specialized needs, such as hemophilia or other clotting disorders. BEFORE THE TRANSFUSION  Who gives blood for transfusions?   Healthy volunteers who are fully evaluated to make sure their blood is safe. This is blood bank blood. Transfusion therapy is the safest it has ever been in the practice of medicine. Before blood is taken from a donor, a complete history is taken to make sure that person has no history of diseases nor engages in risky social behavior (examples are intravenous drug use or sexual activity with multiple partners). The donor's travel history is screened to minimize risk of transmitting infections, such as malaria. The donated blood is tested for signs of infectious diseases, such as HIV and hepatitis. The blood is then tested to be sure it is compatible with you in order to minimize the chance of a transfusion reaction. If you or a relative donates blood, this is often done in anticipation of surgery and is not appropriate for emergency  situations. It takes many days to process the donated blood. RISKS AND COMPLICATIONS Although transfusion therapy is very safe and saves many lives, the main dangers of transfusion include:   Getting an infectious disease.  Developing a transfusion reaction. This is an allergic reaction  to something in the blood you were given. Every precaution is taken to prevent this. The decision to have a blood transfusion has been considered carefully by your caregiver before blood is given. Blood is not given unless the benefits outweigh the risks. AFTER THE TRANSFUSION  Right after receiving a blood transfusion, you will usually feel much better and more energetic. This is especially true if your red blood cells have gotten low (anemic). The transfusion raises the level of the red blood cells which carry oxygen, and this usually causes an energy increase.  The nurse administering the transfusion will monitor you carefully for complications. HOME CARE INSTRUCTIONS  No special instructions are needed after a transfusion. You may find your energy is better. Speak with your caregiver about any limitations on activity for underlying diseases you may have. SEEK MEDICAL CARE IF:   Your condition is not improving after your transfusion.  You develop redness or irritation at the intravenous (IV) site. SEEK IMMEDIATE MEDICAL CARE IF:  Any of the following symptoms occur over the next 12 hours:  Shaking chills.  You have a temperature by mouth above 102 F (38.9 C), not controlled by medicine.  Chest, back, or muscle pain.  People around you feel you are not acting correctly or are confused.  Shortness of breath or difficulty breathing.  Dizziness and fainting.  You get a rash or develop hives.  You have a decrease in urine output.  Your urine turns a dark color or changes to pink, red, or brown. Any of the following symptoms occur over the next 10 days:  You have a temperature by mouth above  102 F (38.9 C), not controlled by medicine.  Shortness of breath.  Weakness after normal activity.  The white part of the eye turns yellow (jaundice).  You have a decrease in the amount of urine or are urinating less often.  Your urine turns a dark color or changes to pink, red, or brown. Document Released: 09/02/2000 Document Revised: 11/28/2011 Document Reviewed: 04/21/2008 [facility] Patient Information 2014 [place], [place].  _______________________________________________________________________

## 2016-10-03 NOTE — Progress Notes (Signed)
BMP 09/27/2016 in chart CBC 09/27/2016 in chart EKG 09/21/16 epic LOV 09/06/16 Dr Joylene Draft in chart [place] cardio 09/21/16 in epic

## 2016-10-04 ENCOUNTER — Ambulatory Visit (HOSPITAL_BASED_OUTPATIENT_CLINIC_OR_DEPARTMENT_OTHER): Payer: Medicare Other

## 2016-10-04 ENCOUNTER — Ambulatory Visit (HOSPITAL_COMMUNITY): Payer: Medicare Other | Attending: Cardiovascular Disease

## 2016-10-04 ENCOUNTER — Other Ambulatory Visit: Payer: Self-pay

## 2016-10-04 DIAGNOSIS — R079 Chest pain, unspecified: Secondary | ICD-10-CM | POA: Diagnosis not present

## 2016-10-04 DIAGNOSIS — I447 Left bundle-branch block, unspecified: Secondary | ICD-10-CM

## 2016-10-04 DIAGNOSIS — R011 Cardiac murmur, unspecified: Secondary | ICD-10-CM | POA: Diagnosis not present

## 2016-10-04 DIAGNOSIS — Z01818 Encounter for other preprocedural examination: Secondary | ICD-10-CM

## 2016-10-04 DIAGNOSIS — I080 Rheumatic disorders of both mitral and aortic valves: Secondary | ICD-10-CM | POA: Insufficient documentation

## 2016-10-04 DIAGNOSIS — Z01810 Encounter for preprocedural cardiovascular examination: Secondary | ICD-10-CM | POA: Diagnosis not present

## 2016-10-04 LAB — ECHOCARDIOGRAM COMPLETE
HEIGHTINCHES: 61 in
Weight: 1888 oz

## 2016-10-04 MED ORDER — TECHNETIUM TC 99M TETROFOSMIN IV KIT
30.3000 | PACK | Freq: Once | INTRAVENOUS | Status: AC | PRN
  Administered 2016-10-04: 30.3 via INTRAVENOUS
  Filled 2016-10-04: qty 31

## 2016-10-05 ENCOUNTER — Inpatient Hospital Stay (HOSPITAL_COMMUNITY)
Admission: RE | Admit: 2016-10-05 | Discharge: 2016-10-05 | Disposition: A | Discharge status: Home / Self Care | Payer: Medicare Other | Source: Ambulatory Visit

## 2016-10-05 ENCOUNTER — Ambulatory Visit (HOSPITAL_COMMUNITY): Payer: Medicare Other

## 2016-10-05 ENCOUNTER — Other Ambulatory Visit (HOSPITAL_COMMUNITY): Payer: Medicare Other

## 2016-10-05 ENCOUNTER — Encounter (HOSPITAL_COMMUNITY)
Admission: RE | Admit: 2016-10-05 | Discharge: 2016-10-05 | Disposition: A | Discharge status: Home / Self Care | Payer: Medicare Other | Source: Ambulatory Visit | Attending: Orthopaedic Surgery | Admitting: Orthopaedic Surgery

## 2016-10-05 ENCOUNTER — Encounter (HOSPITAL_COMMUNITY): Payer: Self-pay

## 2016-10-05 DIAGNOSIS — Z01812 Encounter for preprocedural laboratory examination: Secondary | ICD-10-CM | POA: Insufficient documentation

## 2016-10-05 DIAGNOSIS — M1612 Unilateral primary osteoarthritis, left hip: Secondary | ICD-10-CM | POA: Diagnosis not present

## 2016-10-05 HISTORY — DX: Other reaction to spinal and lumbar puncture: G97.1

## 2016-10-05 HISTORY — DX: Cardiac murmur, unspecified: R01.1

## 2016-10-05 HISTORY — DX: Raynaud's syndrome without gangrene: I73.00

## 2016-10-05 HISTORY — DX: Other chronic pain: G89.29

## 2016-10-05 HISTORY — DX: Dorsalgia, unspecified: M54.9

## 2016-10-05 LAB — CBC
HCT: 39.4 % (ref 36.0–46.0)
HEMOGLOBIN: 13.6 g/dL (ref 12.0–15.0)
MCH: 32.5 pg (ref 26.0–34.0)
MCHC: 34.5 g/dL (ref 30.0–36.0)
MCV: 94 fL (ref 78.0–100.0)
Platelets: 273 10*3/uL (ref 150–400)
RBC: 4.19 MIL/uL (ref 3.87–5.11)
RDW: 13.2 % (ref 11.5–15.5)
WBC: 3.5 10*3/uL — ABNORMAL LOW (ref 4.0–10.5)

## 2016-10-05 LAB — BASIC METABOLIC PANEL
ANION GAP: 8 (ref 5–15)
BUN: 12 mg/dL (ref 6–20)
CALCIUM: 9 mg/dL (ref 8.9–10.3)
CO2: 27 mmol/L (ref 22–32)
Chloride: 101 mmol/L (ref 101–111)
Creatinine, Ser: 0.49 mg/dL (ref 0.44–1.00)
GFR calc Af Amer: >60 mL/min (ref >60)
GLUCOSE: 124 mg/dL — AB (ref 65–99)
Potassium: 4.9 mmol/L (ref 3.5–5.1)
Sodium: 136 mmol/L (ref 135–145)

## 2016-10-05 LAB — SURGICAL PCR SCREEN
MRSA, PCR: NEGATIVE
STAPHYLOCOCCUS AUREUS: NEGATIVE

## 2016-10-05 LAB — ABO/RH: ABO/RH(D): O POS

## 2016-10-05 NOTE — Patient Instructions (Addendum)
Tanya Morales  10/05/2016   Your procedure is scheduled on: 10-07-16  Report to [facility]  Entrance take [facility]  elevators to 3rd floor to  [place] at 1230 PM  Call this number if you have problems the morning of surgery [phone]   Remember: ONLY 1 PERSON MAY GO WITH YOU TO SHORT STAY TO GET  READY MORNING OF YOUR SURGERY.  Do not eat food  :After Midnight CLEAR LIQUIDS FROM MIDNIGHT UNTIL UNTIL 830 AM THEN NOTHING BY MOUTH AFTER 830 AM DAY OF SURGERY.    Take these medicines the morning of surgery with A SIP OF WATER: SYNTHROID                               You may not have any metal on your body including hair pins and              piercings  Do not wear jewelry, make-up, lotions, powders or perfumes, deodorant             Do not wear nail polish.  Do not shave  48 hours prior to surgery.              Men may shave face and neck.   Do not bring valuables to the hospital. [place].  Contacts, dentures or bridgework may not be worn into surgery.  Leave suitcase in the car. After surgery it may be brought to your room.                  Please read over the following fact sheets you were given: _____________________________________________________________________                CLEAR LIQUID DIET   Foods Allowed                                                                     Foods Excluded  Coffee and tea, regular and decaf                             liquids that you cannot  Plain Jell-O in any flavor                                             see through such as: Fruit ices (not with fruit pulp)                                     milk, soups, orange juice  Iced Popsicles                                    All solid food Carbonated beverages, regular  and diet                                    Cranberry, grape and apple juices Sports drinks like Gatorade Lightly seasoned  clear broth or consume(fat free) Sugar, honey syrup  Sample Menu Breakfast                                Lunch                                     Supper Cranberry juice                    Beef broth                            Chicken broth Jell-O                                     Grape juice                           Apple juice Coffee or tea                        Jell-O                                      Popsicle                                                Coffee or tea                        Coffee or tea  _____________________________________________________________________  [facility] - Preparing for Surgery Before surgery, you can play an important role.  Because skin is not sterile, your skin needs to be as free of germs as possible.  You can reduce the number of germs on your skin by washing with CHG (chlorahexidine gluconate) soap before surgery.  CHG is an antiseptic cleaner which kills germs and bonds with the skin to continue killing germs even after washing. Please DO NOT use if you have an allergy to CHG or antibacterial soaps.  If your skin becomes reddened/irritated stop using the CHG and inform your nurse when you arrive at Short Stay. Do not shave (including legs and underarms) for at least 48 hours prior to the first CHG shower.  You may shave your face/neck. Please follow these instructions carefully:  1.  Shower with CHG Soap the night before surgery and the  morning of Surgery.  2.  If you choose to wash your hair, wash your hair first as usual with your  normal  shampoo.  3.  After you shampoo, rinse your hair and body thoroughly to remove the  shampoo.  4.  Use CHG as you would any other liquid soap.  You can apply chg directly  to the skin and wash                       Gently with a scrungie or clean washcloth.  5.  Apply the CHG Soap to your body ONLY FROM THE NECK DOWN.   Do not use on face/ open                           Wound or  open sores. Avoid contact with eyes, ears mouth and genitals (private parts).                       Wash face,  Genitals (private parts) with your normal soap.             6.  Wash thoroughly, paying special attention to the area where your surgery  will be performed.  7.  Thoroughly rinse your body with warm water from the neck down.  8.  DO NOT shower/wash with your normal soap after using and rinsing off  the CHG Soap.                9.  Pat yourself dry with a clean towel.            10.  Wear clean pajamas.            11.  Place clean sheets on your bed the night of your first shower and do not  sleep with pets. Day of Surgery : Do not apply any lotions/deodorants the morning of surgery.  Please wear clean clothes to the hospital/surgery center.  FAILURE TO FOLLOW THESE INSTRUCTIONS MAY RESULT IN THE CANCELLATION OF YOUR SURGERY PATIENT SIGNATURE_________________________________  NURSE SIGNATURE__________________________________  ________________________________________________________________________  WHAT IS A BLOOD TRANSFUSION? Blood Transfusion Information  A transfusion is the replacement of blood or some of its parts. Blood is made up of multiple cells which provide different functions.  Red blood cells carry oxygen and are used for blood loss replacement.  White blood cells fight against infection.  Platelets control bleeding.  Plasma helps clot blood.  Other blood products are available for specialized needs, such as hemophilia or other clotting disorders. BEFORE THE TRANSFUSION  Who gives blood for transfusions?   Healthy volunteers who are fully evaluated to make sure their blood is safe. This is blood bank blood. Transfusion therapy is the safest it has ever been in the practice of medicine. Before blood is taken from a donor, a complete history is taken to make sure that person has no history of diseases nor engages in risky social behavior (examples are  intravenous drug use or sexual activity with multiple partners). The donor's travel history is screened to minimize risk of transmitting infections, such as malaria. The donated blood is tested for signs of infectious diseases, such as HIV and hepatitis. The blood is then tested to be sure it is compatible with you in order to minimize the chance of a transfusion reaction. If you or a relative donates blood, this is often done in anticipation of surgery and is not appropriate for emergency situations. It takes many days to process the donated blood. RISKS AND COMPLICATIONS Although transfusion therapy is very safe and saves many lives, the main dangers of transfusion include:   Getting an infectious disease.  Developing a transfusion reaction.  This is an allergic reaction to something in the blood you were given. Every precaution is taken to prevent this. The decision to have a blood transfusion has been considered carefully by your caregiver before blood is given. Blood is not given unless the benefits outweigh the risks. AFTER THE TRANSFUSION  Right after receiving a blood transfusion, you will usually feel much better and more energetic. This is especially true if your red blood cells have gotten low (anemic). The transfusion raises the level of the red blood cells which carry oxygen, and this usually causes an energy increase.  The nurse administering the transfusion will monitor you carefully for complications. HOME CARE INSTRUCTIONS  No special instructions are needed after a transfusion. You may find your energy is better. Speak with your caregiver about any limitations on activity for underlying diseases you may have. SEEK MEDICAL CARE IF:   Your condition is not improving after your transfusion.  You develop redness or irritation at the intravenous (IV) site. SEEK IMMEDIATE MEDICAL CARE IF:  Any of the following symptoms occur over the next 12 hours:  Shaking chills.  You have a  temperature by mouth above 102 F (38.9 C), not controlled by medicine.  Chest, back, or muscle pain.  People around you feel you are not acting correctly or are confused.  Shortness of breath or difficulty breathing.  Dizziness and fainting.  You get a rash or develop hives.  You have a decrease in urine output.  Your urine turns a dark color or changes to pink, red, or brown. Any of the following symptoms occur over the next 10 days:  You have a temperature by mouth above 102 F (38.9 C), not controlled by medicine.  Shortness of breath.  Weakness after normal activity.  The white part of the eye turns yellow (jaundice).  You have a decrease in the amount of urine or are urinating less often.  Your urine turns a dark color or changes to pink, red, or brown. Document Released: 09/02/2000 Document Revised: 11/28/2011 Document Reviewed: 04/21/2008 [facility] Patient Information 2014 [place], [place].  _______________________________________________________________________

## 2016-10-06 ENCOUNTER — Telehealth: Payer: Self-pay | Admitting: Physician Assistant

## 2016-10-06 NOTE — Progress Notes (Signed)
Patient was to have stress test done 1-18 -2018 ,however cardio office is closed due to weather. Son Elta Guadeloupe called and he was instructed to call Dr Ninfa Linden and notify him that patient will not have cardiac clearance for surgery  10-07-16  and needs to reschedule in 30 days

## 2016-10-06 NOTE — Telephone Encounter (Signed)
Pt's son called answering service on snow day. Pt is supposed to have elective hip surgery tomorrow. Recently seen by Dr. Johnsie Cancel who advised pre-operative stress testing. The nuc was delayed due to the snow day. He called [facility] surgery to let them know this and was advised there may be somewhere else in the [place] this can be done. All [facility] offices are currently closed. The only possibility would be through the hospital but the patient has already eaten this morning. I advised that I would send this message to our triage folks to view when office re-opens tomorrow to help reschedule the nuclear stress test, and advised he contact her surgeon to let him know she has not yet had pre-op cardiac testing as advised. He was grateful for call. Cherine Drumgoole PA-C

## 2016-10-07 ENCOUNTER — Telehealth (HOSPITAL_COMMUNITY): Payer: Self-pay | Admitting: *Deleted

## 2016-10-07 LAB — TYPE AND SCREEN
ABO/RH(D): O POS
Antibody Screen: NEGATIVE

## 2016-10-07 NOTE — Telephone Encounter (Signed)
Attempted to contact patient regarding upcoming appointment on 10/10/16- phone busy x 2.   Tanya Morales

## 2016-10-10 ENCOUNTER — Ambulatory Visit (HOSPITAL_COMMUNITY): Payer: Medicare Other | Attending: Cardiovascular Disease

## 2016-10-10 DIAGNOSIS — I080 Rheumatic disorders of both mitral and aortic valves: Secondary | ICD-10-CM | POA: Diagnosis not present

## 2016-10-10 DIAGNOSIS — Z01810 Encounter for preprocedural cardiovascular examination: Secondary | ICD-10-CM | POA: Diagnosis not present

## 2016-10-10 DIAGNOSIS — R011 Cardiac murmur, unspecified: Secondary | ICD-10-CM | POA: Diagnosis not present

## 2016-10-10 DIAGNOSIS — R079 Chest pain, unspecified: Secondary | ICD-10-CM | POA: Diagnosis not present

## 2016-10-10 DIAGNOSIS — I447 Left bundle-branch block, unspecified: Secondary | ICD-10-CM | POA: Diagnosis not present

## 2016-10-10 LAB — MYOCARDIAL PERFUSION IMAGING
CHL CUP NUCLEAR SRS: 9
CHL CUP RESTING HR STRESS: 82 {beats}/min
LV sys vol: 30 mL
LVDIAVOL: 81 mL (ref 46–106)
NUC STRESS TID: 0.88
Peak HR: 103 {beats}/min
RATE: 0.3
SDS: 3
SSS: 10

## 2016-10-10 MED ORDER — TECHNETIUM TC 99M TETROFOSMIN IV KIT
32.7000 | PACK | Freq: Once | INTRAVENOUS | Status: AC | PRN
  Administered 2016-10-10: 32.7 via INTRAVENOUS
  Filled 2016-10-10: qty 33

## 2016-10-10 MED ORDER — REGADENOSON 0.4 MG/5ML IV SOLN
0.4000 mg | Freq: Once | INTRAVENOUS | Status: AC
Start: 2016-10-10 — End: 2016-10-10
  Administered 2016-10-10: 0.4 mg via INTRAVENOUS

## 2016-10-11 ENCOUNTER — Telehealth: Payer: Self-pay

## 2016-10-11 NOTE — Telephone Encounter (Signed)
-----   Message from Josue Hector, MD sent at 10/10/2016  5:43 PM EST ----- Abnormal myovue needs cath this week to clear for ortho surgery echo is ok

## 2016-10-11 NOTE — Telephone Encounter (Signed)
Patient's son Elta Guadeloupe had a few questions about is mom's procedure tomorrow. He wanted to know if patient could have any clear liquids tomorrow morning. Consulted Dr. Tamala Julian, DOD, he recommend patient can have clear liquids in the morning, but not after 7:00 am. Informed patient's son of Dr. Thompson Caul recommendations and clarified with him what a clear diet entails. Patient's son Elta Guadeloupe also was worried about patient taking Tramadol that morning. Informed patient's son that she is able to take her morning medications with a sip of water. Patient's son verbalized understanding.

## 2016-10-12 ENCOUNTER — Ambulatory Visit (HOSPITAL_COMMUNITY)
Admission: RE | Admit: 2016-10-12 | Discharge: 2016-10-12 | Disposition: A | Discharge status: Home / Self Care | Payer: Medicare Other | Source: Ambulatory Visit | Attending: Interventional Cardiology | Admitting: Interventional Cardiology

## 2016-10-12 ENCOUNTER — Encounter (HOSPITAL_COMMUNITY)
Admission: RE | Disposition: A | Discharge status: Home / Self Care | Payer: Self-pay | Source: Ambulatory Visit | Attending: Interventional Cardiology

## 2016-10-12 DIAGNOSIS — Z853 Personal history of malignant neoplasm of breast: Secondary | ICD-10-CM | POA: Diagnosis not present

## 2016-10-12 DIAGNOSIS — N189 Chronic kidney disease, unspecified: Secondary | ICD-10-CM | POA: Diagnosis not present

## 2016-10-12 DIAGNOSIS — Z87891 Personal history of nicotine dependence: Secondary | ICD-10-CM | POA: Insufficient documentation

## 2016-10-12 DIAGNOSIS — Z7982 Long term (current) use of aspirin: Secondary | ICD-10-CM | POA: Insufficient documentation

## 2016-10-12 DIAGNOSIS — Z8249 Family history of ischemic heart disease and other diseases of the circulatory system: Secondary | ICD-10-CM | POA: Diagnosis not present

## 2016-10-12 DIAGNOSIS — I447 Left bundle-branch block, unspecified: Secondary | ICD-10-CM | POA: Diagnosis not present

## 2016-10-12 DIAGNOSIS — I2584 Coronary atherosclerosis due to calcified coronary lesion: Secondary | ICD-10-CM | POA: Insufficient documentation

## 2016-10-12 DIAGNOSIS — R9439 Abnormal result of other cardiovascular function study: Secondary | ICD-10-CM | POA: Diagnosis present

## 2016-10-12 DIAGNOSIS — E785 Hyperlipidemia, unspecified: Secondary | ICD-10-CM | POA: Diagnosis not present

## 2016-10-12 DIAGNOSIS — Z8673 Personal history of transient ischemic attack (TIA), and cerebral infarction without residual deficits: Secondary | ICD-10-CM | POA: Diagnosis not present

## 2016-10-12 DIAGNOSIS — R011 Cardiac murmur, unspecified: Secondary | ICD-10-CM | POA: Insufficient documentation

## 2016-10-12 DIAGNOSIS — E039 Hypothyroidism, unspecified: Secondary | ICD-10-CM | POA: Insufficient documentation

## 2016-10-12 DIAGNOSIS — I129 Hypertensive chronic kidney disease with stage 1 through stage 4 chronic kidney disease, or unspecified chronic kidney disease: Secondary | ICD-10-CM | POA: Insufficient documentation

## 2016-10-12 DIAGNOSIS — I2510 Atherosclerotic heart disease of native coronary artery without angina pectoris: Secondary | ICD-10-CM | POA: Diagnosis not present

## 2016-10-12 DIAGNOSIS — M1612 Unilateral primary osteoarthritis, left hip: Secondary | ICD-10-CM | POA: Insufficient documentation

## 2016-10-12 HISTORY — PX: CARDIAC CATHETERIZATION: SHX172

## 2016-10-12 LAB — PROTIME-INR
INR: 1.04
Prothrombin Time: 13.6 seconds (ref 11.4–15.2)

## 2016-10-12 SURGERY — LEFT HEART CATH AND CORONARY ANGIOGRAPHY

## 2016-10-12 MED ORDER — LIDOCAINE HCL (PF) 1 % IJ SOLN
INTRAMUSCULAR | Status: DC | PRN
  Administered 2016-10-12: 2 mL

## 2016-10-12 MED ORDER — FENTANYL CITRATE (PF) 100 MCG/2ML IJ SOLN
INTRAMUSCULAR | Status: AC
  Filled 2016-10-12: qty 2

## 2016-10-12 MED ORDER — VERAPAMIL HCL 2.5 MG/ML IV SOLN
INTRAVENOUS | Status: AC
  Filled 2016-10-12: qty 2

## 2016-10-12 MED ORDER — HEPARIN SODIUM (PORCINE) 1000 UNIT/ML IJ SOLN
INTRAMUSCULAR | Status: AC
  Filled 2016-10-12: qty 1

## 2016-10-12 MED ORDER — VERAPAMIL HCL 2.5 MG/ML IV SOLN
INTRAVENOUS | Status: DC | PRN
  Administered 2016-10-12: 10 mL via INTRA_ARTERIAL

## 2016-10-12 MED ORDER — IOPAMIDOL (ISOVUE-370) INJECTION 76%
INTRAVENOUS | Status: DC | PRN
  Administered 2016-10-12: 65 mL via INTRAVENOUS

## 2016-10-12 MED ORDER — SODIUM CHLORIDE 0.9% FLUSH: 3.0000 mL | Freq: Two times a day (BID) | INTRAVENOUS | Status: DC

## 2016-10-12 MED ORDER — ASPIRIN 81 MG PO CHEW
CHEWABLE_TABLET | ORAL | Status: AC
  Filled 2016-10-12: qty 1

## 2016-10-12 MED ORDER — HEPARIN (PORCINE) IN NACL 2-0.9 UNIT/ML-% IJ SOLN
INTRAMUSCULAR | Status: DC | PRN
  Administered 2016-10-12: 16:00:00

## 2016-10-12 MED ORDER — SODIUM CHLORIDE 0.9 % WEIGHT BASED INFUSION: 1.0000 mL/kg/h | INTRAVENOUS | Status: DC

## 2016-10-12 MED ORDER — SODIUM CHLORIDE 0.9 % IV SOLN: 250.0000 mL | INTRAVENOUS | Status: DC | PRN

## 2016-10-12 MED ORDER — SODIUM CHLORIDE 0.9 % IV SOLN: INTRAVENOUS | Status: AC

## 2016-10-12 MED ORDER — SODIUM CHLORIDE 0.9% FLUSH: 3.0000 mL | INTRAVENOUS | Status: DC | PRN

## 2016-10-12 MED ORDER — MIDAZOLAM HCL 2 MG/2ML IJ SOLN
INTRAMUSCULAR | Status: AC
  Filled 2016-10-12: qty 2

## 2016-10-12 MED ORDER — SODIUM CHLORIDE 0.9 % WEIGHT BASED INFUSION
3.0000 mL/kg/h | INTRAVENOUS | Status: AC
  Administered 2016-10-12: 3 mL/kg/h via INTRAVENOUS

## 2016-10-12 MED ORDER — HEPARIN (PORCINE) IN NACL 2-0.9 UNIT/ML-% IJ SOLN
INTRAMUSCULAR | Status: AC
  Filled 2016-10-12: qty 1000

## 2016-10-12 MED ORDER — LIDOCAINE HCL (PF) 1 % IJ SOLN
INTRAMUSCULAR | Status: AC
  Filled 2016-10-12: qty 30

## 2016-10-12 MED ORDER — IOPAMIDOL (ISOVUE-370) INJECTION 76%
INTRAVENOUS | Status: AC
  Filled 2016-10-12: qty 100

## 2016-10-12 MED ORDER — HEPARIN SODIUM (PORCINE) 1000 UNIT/ML IJ SOLN
INTRAMUSCULAR | Status: DC | PRN
  Administered 2016-10-12: 3000 [IU] via INTRAVENOUS

## 2016-10-12 MED ORDER — MIDAZOLAM HCL 2 MG/2ML IJ SOLN
INTRAMUSCULAR | Status: DC | PRN
  Administered 2016-10-12 (×2): 1 mg via INTRAVENOUS

## 2016-10-12 MED ORDER — FENTANYL CITRATE (PF) 100 MCG/2ML IJ SOLN
INTRAMUSCULAR | Status: DC | PRN
  Administered 2016-10-12 (×2): 25 ug via INTRAVENOUS

## 2016-10-12 MED ORDER — ASPIRIN 81 MG PO CHEW
81.0000 mg | CHEWABLE_TABLET | ORAL | Status: AC
  Administered 2016-10-12: 81 mg via ORAL

## 2016-10-12 SURGICAL SUPPLY — 10 items
CATH 5FR JL3.5 JR4 ANG PIG MP (CATHETERS) ×3 IMPLANT
DEVICE RAD COMP TR BAND LRG (VASCULAR PRODUCTS) ×3 IMPLANT
GLIDESHEATH SLEND SS 6F .021 (SHEATH) ×3 IMPLANT
GUIDEWIRE INQWIRE 1.5J.035X260 (WIRE) ×1 IMPLANT
INQWIRE 1.5J .035X260CM (WIRE) ×3
KIT HEART LEFT (KITS) ×3 IMPLANT
PACK CARDIAC CATHETERIZATION (CUSTOM PROCEDURE TRAY) ×3 IMPLANT
TRANSDUCER W/STOPCOCK (MISCELLANEOUS) ×3 IMPLANT
TUBING CIL FLEX 10 FLL-RA (TUBING) ×3 IMPLANT
WIRE HI TORQ VERSACORE-J 145CM (WIRE) ×3 IMPLANT

## 2016-10-12 NOTE — H&P (View-Only) (Signed)
Cardiology Office Note2   Date:  09/21/2016   ID:  Tanya Morales, DOB [birth year], MRN [id number]  PCP:  Tanya Ly, MD  Cardiologist:   Tanya Rouge, MD   No chief complaint on file.     History of Present Illness: Tanya Morales is a 83 y.o. female who presents for evaluation of chest pain.  CRF;s HTN and elevated lipids She Is preop for left hip surgery with Tanya Morales Vague notes by Tanya Tanya Morales about chest pain with "red face" at night when She goes to bed Takes tramadol for hip pain at night   Labs from 09/15/16 reviewed K 4.7 Cr .7 LDL 97 PLT 307 Hct 44.4   She reports over last few months getter pain in center of chest when trying To get ready for bed.  Stressful due to the pian in her hip and takes her a long Time Pain goes away when she is in the bed and not stressed No associated dyspnea palpitations or diaphoresis   Past Medical History:  Diagnosis Date  . Arthritis   . Breast cancer, stage 1 ([place]) 03/14/2011   left  . Hyperlipidemia   . Hypertension   . Hypothyroidism   . Pain    arthritis, left hip  . TIA (transient ischemic attack) march 2012    Past Surgical History:  Procedure Laterality Date  . BREAST SURGERY Left 02/23/11   left lumpectomy  . FINGER SURGERY    . TONSILLECTOMY AND ADENOIDECTOMY    . TOTAL ABDOMINAL HYSTERECTOMY       Current Outpatient Prescriptions  Medication Sig Dispense Refill  . amLODipine (NORVASC) 5 MG tablet Take 5 mg by mouth daily.      Marland Kitchen aspirin 325 MG tablet Take 325 mg by mouth daily.      . Calcium-Magnesium (CAL-MAG) 500-250 MG TABS Take by mouth.      . cholecalciferol (VITAMIN D) 400 UNITS TABS tablet Take 400 Units by mouth.    . clobetasol (TEMOVATE) 0.05 % GEL Apply 1 application topically daily.    Marland Kitchen co-enzyme Q-10 50 MG capsule Take 200 mg by mouth daily.    Marland Kitchen denosumab (PROLIA) 60 MG/ML SOLN injection Inject 60 mg into the skin every 6 (six) months. Administer in upper arm, thigh, or abdomen    .  FLUAD 0.5 ML SUSY inject 0.5 milliliter intramuscularly  0  . levothyroxine (SYNTHROID, LEVOTHROID) 50 MCG tablet Take 50 mcg by mouth daily.      . Multiple Vitamin (MULTIVITAMIN) capsule Take 1 capsule by mouth daily.      Marland Kitchen olmesartan (BENICAR) 20 MG tablet Take 20 mg by mouth daily.      . simvastatin (ZOCOR) 20 MG tablet Take 10 mg by mouth at bedtime.     . traMADol (ULTRAM) 50 MG tablet Take 50 mg by mouth 4 (four) times daily.    . vitamin C (ASCORBIC ACID) 500 MG tablet Take 500 mg by mouth daily.       No current facility-administered medications for this visit.     Allergies:   Celebrex [celecoxib] and Epinephrine    Social History:  The patient  reports that she quit smoking about 49 years ago. She has never used smokeless tobacco. She reports that she does not drink alcohol or use drugs.   Family History:  The patient's family history includes Cancer in her maternal grandmother; Congestive Heart Failure in her mother; Healthy in her sister and son; Other in  her son; Rheum arthritis in her sister.    ROS:  Please see the history of present illness.   Otherwise, review of systems are positive for none.   All other systems are reviewed and negative.    PHYSICAL EXAM: VS:  BP 124/62   Pulse 76   Ht 5\' 1"  (1.549 m)   Wt 118 lb (53.5 kg)   BMI 22.30 kg/m  , BMI Body mass index is 22.3 kg/m. Affect appropriate Healthy:  appears stated age 37: normal Neck supple with no adenopathy JVP normal no bruits no thyromegaly Lungs clear with no wheezing and good diaphragmatic motion Heart:  S1/S2 AV sclerosis murmur, no rub, gallop or click PMI normal Abdomen: benighn, BS positve, no tenderness, no AAA no bruit.  No HSM or HJR Distal pulses intact with no bruits No edema Neuro non-focal Skin warm and dry No muscular weakness    EKG:  2012 ST PVC LBBB  08/17/11 SR rate 65 LBBB    Recent Labs: No results found for requested labs within last 8760 hours.    Lipid  Panel    Component Value Date/Time   CHOL  12/13/2010 0505    148        ATP III CLASSIFICATION:  <200     mg/dL   Desirable  200-239  mg/dL   Borderline High  >=240    mg/dL   High          TRIG 75 12/13/2010 0505   HDL 65 12/13/2010 0505   CHOLHDL 2.3 12/13/2010 0505   VLDL 15 12/13/2010 0505   LDLCALC  12/13/2010 0505    68        Total Cholesterol/HDL:CHD Risk Coronary Heart Disease Risk Table                     Men   Women  1/2 Average Risk   3.4   3.3  Average Risk       5.0   4.4  2 X Average Risk   9.6   7.1  3 X Average Risk  23.4   11.0        Use the calculated Patient Ratio above and the CHD Risk Table to determine the patient's CHD Risk.        ATP III CLASSIFICATION (LDL):  <100     mg/dL   Optimal  100-129  mg/dL   Near or Above                    Optimal  130-159  mg/dL   Borderline  160-189  mg/dL   High  >190     mg/dL   Very High      Wt Readings from Last 3 Encounters:  09/21/16 118 lb (53.5 kg)  09/07/16 114 lb (51.7 kg)  07/18/16 120 lb 6.4 oz (54.6 kg)      Other studies Reviewed: Additional studies/ records that were reviewed today include: Notes Tanya Morales and notes Tanya Morales old [place] [facility] [place].    ASSESSMENT AND PLAN:  1.  Preop: give age , moderate risk surgery abnormal ECG and symptoms  Will order lexiscan myovue 2. Chest pain see above ECG unrevealing due to LBBB 3. LBBB Chronic no high grade AV block yearly ECG 4. HTN Well controlled.  Continue current medications and low sodium Dash type diet.   5. Thyroid  On replacement labs with primary  6. Murmur: AV sclerosis doubt stenosis  f/u Echo   Current medicines are reviewed at length with the patient today.  The patient does not have concerns regarding medicines.  The following changes have been made:  no change  Labs/ tests ordered today include: Echo Lexiscan myovue   Orders Placed This Encounter  Procedures  . Myocardial Perfusion Imaging  . EKG 12-Lead  .  ECHOCARDIOGRAM COMPLETE     Disposition:   FU with me in 6 months      Signed, Tanya Rouge, MD  09/21/2016 3:13 PM    [facility] [address], [place], [place]  [address] Phone: [phone]; Fax: [phone]

## 2016-10-12 NOTE — Interval H&P Note (Signed)
Cath Lab Visit (complete for each Cath Lab visit)  Clinical Evaluation Leading to the Procedure:   ACS: No.  Non-ACS:    Anginal Classification: CCS II  Anti-ischemic medical therapy: Minimal Therapy (1 class of medications)  Non-Invasive Test Results: Intermediate-risk stress test findings: cardiac mortality 1-3%/year  Prior CABG: No previous CABG      History and Physical Interval Note:  10/12/2016 3:43 PM  Tanya Morales  has presented today for surgery, with the diagnosis of abnormal stress test  The various methods of treatment have been discussed with the patient and family. After consideration of risks, benefits and other options for treatment, the patient has consented to  Procedure(s): Left Heart Cath and Coronary Angiography (N/A) as a surgical intervention .  The patient's history has been reviewed, patient examined, no change in status, stable for surgery.  I have reviewed the patient's chart and labs.  Questions were answered to the patient's satisfaction.     Larae Grooms

## 2016-10-12 NOTE — Discharge Instructions (Signed)

## 2016-10-13 ENCOUNTER — Encounter (HOSPITAL_COMMUNITY): Payer: Self-pay | Admitting: Interventional Cardiology

## 2016-10-17 ENCOUNTER — Telehealth: Payer: Self-pay | Admitting: Cardiovascular Disease

## 2016-10-17 NOTE — Telephone Encounter (Signed)
Dr. Trevor Mace office is calling to see if patient is cleared for surgery after heart cath. Will forward to Dr. Johnsie Cancel.

## 2016-10-17 NOTE — Telephone Encounter (Signed)
Tanya Morales calling to check on status of left total hip repair with Dr. Lenon Ahmadi call [phone]

## 2016-10-18 NOTE — Telephone Encounter (Signed)
Left detailed message for Sherri, Dr. Trevor Mace nurse, that patient has been cleared for surgery and note is in EPIC.

## 2016-10-18 NOTE — Telephone Encounter (Signed)
Yes she is cleared for surgery.

## 2016-10-20 ENCOUNTER — Inpatient Hospital Stay (INDEPENDENT_AMBULATORY_CARE_PROVIDER_SITE_OTHER): Payer: Self-pay | Admitting: Orthopaedic Surgery

## 2016-10-20 ENCOUNTER — Other Ambulatory Visit (INDEPENDENT_AMBULATORY_CARE_PROVIDER_SITE_OTHER): Payer: Self-pay | Admitting: Physician Assistant

## 2016-10-25 DIAGNOSIS — R110 Nausea: Secondary | ICD-10-CM | POA: Insufficient documentation

## 2016-10-25 DIAGNOSIS — B370 Candidal stomatitis: Secondary | ICD-10-CM | POA: Insufficient documentation

## 2016-10-25 DIAGNOSIS — E038 Other specified hypothyroidism: Secondary | ICD-10-CM | POA: Diagnosis not present

## 2016-10-25 DIAGNOSIS — M1990 Unspecified osteoarthritis, unspecified site: Secondary | ICD-10-CM | POA: Diagnosis not present

## 2016-10-25 DIAGNOSIS — I10 Essential (primary) hypertension: Secondary | ICD-10-CM | POA: Diagnosis not present

## 2016-10-25 DIAGNOSIS — Z6821 Body mass index (BMI) 21.0-21.9, adult: Secondary | ICD-10-CM | POA: Diagnosis not present

## 2016-10-25 DIAGNOSIS — R634 Abnormal weight loss: Secondary | ICD-10-CM | POA: Diagnosis not present

## 2016-10-31 ENCOUNTER — Encounter (HOSPITAL_COMMUNITY)
Admission: RE | Admit: 2016-10-31 | Discharge: 2016-10-31 | Disposition: A | Discharge status: Home / Self Care | Payer: Medicare Other | Source: Ambulatory Visit | Attending: Orthopaedic Surgery | Admitting: Orthopaedic Surgery

## 2016-10-31 ENCOUNTER — Encounter (INDEPENDENT_AMBULATORY_CARE_PROVIDER_SITE_OTHER): Payer: Self-pay

## 2016-10-31 ENCOUNTER — Encounter (HOSPITAL_COMMUNITY): Payer: Self-pay

## 2016-10-31 DIAGNOSIS — Z01812 Encounter for preprocedural laboratory examination: Secondary | ICD-10-CM | POA: Diagnosis not present

## 2016-10-31 DIAGNOSIS — M1612 Unilateral primary osteoarthritis, left hip: Secondary | ICD-10-CM | POA: Diagnosis not present

## 2016-10-31 LAB — SURGICAL PCR SCREEN
MRSA, PCR: NEGATIVE
STAPHYLOCOCCUS AUREUS: NEGATIVE

## 2016-10-31 NOTE — Patient Instructions (Addendum)
Tanya Morales  10/31/2016   Your procedure is scheduled on: 11-04-16  Report to [facility] Main  Entrance take [facility]  elevators to 3rd floor to  [place] at 1200 PM  Call this number if you have problems the morning of surgery [phone]   Remember: ONLY 1 PERSON MAY GO WITH YOU TO SHORT STAY TO GET  READY MORNING OF YOUR SURGERY.  Do not eat food :After Midnight, clear liquids from midnight until 0800 am, then nothing by mouth after 800 am day of surgery.      Take these medicines the morning of surgery with A SIP OF WATER: SYNTHROID, NORVASC, TRAMADOL IF NEEDED                               You may not have any metal on your body including hair pins and              piercings  Do not wear jewelry, make-up, lotions, powders or perfumes, deodorant             Do not wear nail polish.  Do not shave  48 hours prior to surgery.              Men may shave face and neck.   Do not bring valuables to the hospital. [place].  Contacts, dentures or bridgework may not be worn into surgery.  Leave suitcase in the car. After surgery it may be brought to your room.                 Please read over the following fact sheets you were given: _____________________________________________________________________                CLEAR LIQUID DIET   Foods Allowed                                                                     Foods Excluded  Coffee and tea, regular and decaf                             liquids that you cannot  Plain Jell-O in any flavor                                             see through such as: Fruit ices (not with fruit pulp)                                     milk, soups, orange juice  Iced Popsicles                                    All solid food  Carbonated beverages, regular and diet                                    Cranberry, grape and apple juices Sports drinks like  Gatorade Lightly seasoned clear broth or consume(fat free) Sugar, honey syrup  Sample Menu Breakfast                                Lunch                                     Supper Cranberry juice                    Beef broth                            Chicken broth Jell-O                                     Grape juice                           Apple juice Coffee or tea                        Jell-O                                      Popsicle                                                Coffee or tea                        Coffee or tea  _____________________________________________________________________  [facility] - Preparing for Surgery Before surgery, you can play an important role.  Because skin is not sterile, your skin needs to be as free of germs as possible.  You can reduce the number of germs on your skin by washing with CHG (chlorahexidine gluconate) soap before surgery.  CHG is an antiseptic cleaner which kills germs and bonds with the skin to continue killing germs even after washing. Please DO NOT use if you have an allergy to CHG or antibacterial soaps.  If your skin becomes reddened/irritated stop using the CHG and inform your nurse when you arrive at Short Stay. Do not shave (including legs and underarms) for at least 48 hours prior to the first CHG shower.  You may shave your face/neck. Please follow these instructions carefully:  1.  Shower with CHG Soap the night before surgery and the  morning of Surgery.  2.  If you choose to wash your hair, wash your hair first as usual with your  normal  shampoo.  3.  After you shampoo, rinse your hair and body thoroughly to remove the  shampoo.  4.  Use CHG as you would any other liquid soap.  You can apply chg directly  to the skin and wash                       Gently with a scrungie or clean washcloth.  5.  Apply the CHG Soap to your body ONLY FROM THE NECK DOWN.   Do not use on face/ open                            Wound or open sores. Avoid contact with eyes, ears mouth and genitals (private parts).                       Wash face,  Genitals (private parts) with your normal soap.             6.  Wash thoroughly, paying special attention to the area where your surgery  will be performed.  7.  Thoroughly rinse your body with warm water from the neck down.  8.  DO NOT shower/wash with your normal soap after using and rinsing off  the CHG Soap.                9.  Pat yourself dry with a clean towel.            10.  Wear clean pajamas.            11.  Place clean sheets on your bed the night of your first shower and do not  sleep with pets. Day of Surgery : Do not apply any lotions/deodorants the morning of surgery.  Please wear clean clothes to the hospital/surgery center.  FAILURE TO FOLLOW THESE INSTRUCTIONS MAY RESULT IN THE CANCELLATION OF YOUR SURGERY PATIENT SIGNATURE_________________________________  NURSE SIGNATURE__________________________________  ________________________________________________________________________  WHAT IS A BLOOD TRANSFUSION? Blood Transfusion Information  A transfusion is the replacement of blood or some of its parts. Blood is made up of multiple cells which provide different functions.  Red blood cells carry oxygen and are used for blood loss replacement.  White blood cells fight against infection.  Platelets control bleeding.  Plasma helps clot blood.  Other blood products are available for specialized needs, such as hemophilia or other clotting disorders. BEFORE THE TRANSFUSION  Who gives blood for transfusions?   Healthy volunteers who are fully evaluated to make sure their blood is safe. This is blood bank blood. Transfusion therapy is the safest it has ever been in the practice of medicine. Before blood is taken from a donor, a complete history is taken to make sure that person has no history of diseases nor engages in risky social behavior  (examples are intravenous drug use or sexual activity with multiple partners). The donor's travel history is screened to minimize risk of transmitting infections, such as malaria. The donated blood is tested for signs of infectious diseases, such as HIV and hepatitis. The blood is then tested to be sure it is compatible with you in order to minimize the chance of a transfusion reaction. If you or a relative donates blood, this is often done in anticipation of surgery and is not appropriate for emergency situations. It takes many days to process the donated blood. RISKS AND COMPLICATIONS Although transfusion therapy is very safe and saves many lives, the main dangers of transfusion include:   Getting an infectious disease.  Developing a transfusion reaction.  This is an allergic reaction to something in the blood you were given. Every precaution is taken to prevent this. The decision to have a blood transfusion has been considered carefully by your caregiver before blood is given. Blood is not given unless the benefits outweigh the risks. AFTER THE TRANSFUSION  Right after receiving a blood transfusion, you will usually feel much better and more energetic. This is especially true if your red blood cells have gotten low (anemic). The transfusion raises the level of the red blood cells which carry oxygen, and this usually causes an energy increase.  The nurse administering the transfusion will monitor you carefully for complications. HOME CARE INSTRUCTIONS  No special instructions are needed after a transfusion. You may find your energy is better. Speak with your caregiver about any limitations on activity for underlying diseases you may have. SEEK MEDICAL CARE IF:   Your condition is not improving after your transfusion.  You develop redness or irritation at the intravenous (IV) site. SEEK IMMEDIATE MEDICAL CARE IF:  Any of the following symptoms occur over the next 12 hours:  Shaking  chills.  You have a temperature by mouth above 102 F (38.9 C), not controlled by medicine.  Chest, back, or muscle pain.  People around you feel you are not acting correctly or are confused.  Shortness of breath or difficulty breathing.  Dizziness and fainting.  You get a rash or develop hives.  You have a decrease in urine output.  Your urine turns a dark color or changes to pink, red, or brown. Any of the following symptoms occur over the next 10 days:  You have a temperature by mouth above 102 F (38.9 C), not controlled by medicine.  Shortness of breath.  Weakness after normal activity.  The white part of the eye turns yellow (jaundice).  You have a decrease in the amount of urine or are urinating less often.  Your urine turns a dark color or changes to pink, red, or brown. Document Released: 09/02/2000 Document Revised: 11/28/2011 Document Reviewed: 04/21/2008 [facility] Patient Information 2014 [place], [place].  _______________________________________________________________________

## 2016-10-31 NOTE — Progress Notes (Signed)
CBC AND BMET 10-25-16 DR PERINI ON CHART

## 2016-10-31 NOTE — Progress Notes (Signed)
10-10-16 STRESS TEST EPIC CARDIAC CLEARANCE NOTE 10-17-16 DR [facility] EPIC

## 2016-11-04 ENCOUNTER — Inpatient Hospital Stay (HOSPITAL_COMMUNITY): Payer: Medicare Other

## 2016-11-04 ENCOUNTER — Inpatient Hospital Stay (HOSPITAL_COMMUNITY)
Admission: RE | Admit: 2016-11-04 | Discharge: 2016-11-07 | DRG: 470 | Disposition: A | Discharge status: Skilled Nursing Facility | Payer: Medicare Other | Source: Ambulatory Visit | Attending: Orthopaedic Surgery | Admitting: Orthopaedic Surgery

## 2016-11-04 ENCOUNTER — Inpatient Hospital Stay (HOSPITAL_COMMUNITY): Payer: Medicare Other | Admitting: Registered Nurse

## 2016-11-04 ENCOUNTER — Encounter (HOSPITAL_COMMUNITY): Payer: Self-pay

## 2016-11-04 ENCOUNTER — Encounter (HOSPITAL_COMMUNITY)
Admission: RE | Disposition: A | Discharge status: Skilled Nursing Facility | Payer: Self-pay | Source: Ambulatory Visit | Attending: Orthopaedic Surgery

## 2016-11-04 DIAGNOSIS — Z96642 Presence of left artificial hip joint: Secondary | ICD-10-CM

## 2016-11-04 DIAGNOSIS — E785 Hyperlipidemia, unspecified: Secondary | ICD-10-CM | POA: Diagnosis present

## 2016-11-04 DIAGNOSIS — Z853 Personal history of malignant neoplasm of breast: Secondary | ICD-10-CM | POA: Diagnosis not present

## 2016-11-04 DIAGNOSIS — M545 Low back pain: Secondary | ICD-10-CM | POA: Diagnosis not present

## 2016-11-04 DIAGNOSIS — Z8261 Family history of arthritis: Secondary | ICD-10-CM

## 2016-11-04 DIAGNOSIS — M542 Cervicalgia: Secondary | ICD-10-CM | POA: Diagnosis not present

## 2016-11-04 DIAGNOSIS — Z87891 Personal history of nicotine dependence: Secondary | ICD-10-CM

## 2016-11-04 DIAGNOSIS — M1612 Unilateral primary osteoarthritis, left hip: Principal | ICD-10-CM | POA: Diagnosis present

## 2016-11-04 DIAGNOSIS — Z8249 Family history of ischemic heart disease and other diseases of the circulatory system: Secondary | ICD-10-CM

## 2016-11-04 DIAGNOSIS — M549 Dorsalgia, unspecified: Secondary | ICD-10-CM | POA: Diagnosis not present

## 2016-11-04 DIAGNOSIS — Z8673 Personal history of transient ischemic attack (TIA), and cerebral infarction without residual deficits: Secondary | ICD-10-CM

## 2016-11-04 DIAGNOSIS — E039 Hypothyroidism, unspecified: Secondary | ICD-10-CM | POA: Diagnosis present

## 2016-11-04 DIAGNOSIS — Z419 Encounter for procedure for purposes other than remedying health state, unspecified: Secondary | ICD-10-CM

## 2016-11-04 DIAGNOSIS — C50919 Malignant neoplasm of unspecified site of unspecified female breast: Secondary | ICD-10-CM | POA: Diagnosis not present

## 2016-11-04 DIAGNOSIS — Z471 Aftercare following joint replacement surgery: Secondary | ICD-10-CM | POA: Diagnosis not present

## 2016-11-04 DIAGNOSIS — I10 Essential (primary) hypertension: Secondary | ICD-10-CM | POA: Diagnosis present

## 2016-11-04 HISTORY — PX: TOTAL HIP ARTHROPLASTY: SHX124

## 2016-11-04 LAB — TYPE AND SCREEN
ABO/RH(D): O POS
ANTIBODY SCREEN: NEGATIVE

## 2016-11-04 SURGERY — ARTHROPLASTY, HIP, TOTAL, ANTERIOR APPROACH
Anesthesia: Spinal | Laterality: Left

## 2016-11-04 MED ORDER — ACETAMINOPHEN 160 MG/5ML PO SOLN
500.0000 mg | Freq: Once | ORAL | Status: AC
  Administered 2016-11-04: 500 mg via ORAL

## 2016-11-04 MED ORDER — FENTANYL CITRATE (PF) 100 MCG/2ML IJ SOLN
25.0000 ug | INTRAMUSCULAR | Status: DC | PRN
  Administered 2016-11-04 (×3): 50 ug via INTRAVENOUS

## 2016-11-04 MED ORDER — TRANEXAMIC ACID 1000 MG/10ML IV SOLN
1000.0000 mg | INTRAVENOUS | Status: AC
  Administered 2016-11-04: 1000 mg via INTRAVENOUS
  Filled 2016-11-04: qty 1100

## 2016-11-04 MED ORDER — FENTANYL CITRATE (PF) 100 MCG/2ML IJ SOLN
INTRAMUSCULAR | Status: DC | PRN
  Administered 2016-11-04 (×4): 25 ug via INTRAVENOUS

## 2016-11-04 MED ORDER — SODIUM CHLORIDE 0.9 % IV SOLN
INTRAVENOUS | Status: DC
  Administered 2016-11-05 (×2): via INTRAVENOUS

## 2016-11-04 MED ORDER — PROPOFOL 10 MG/ML IV BOLUS
INTRAVENOUS | Status: AC
  Filled 2016-11-04: qty 20

## 2016-11-04 MED ORDER — COENZYME Q10 30 MG PO CAPS: 30.0000 mg | ORAL_CAPSULE | Freq: Every day | ORAL | Status: DC

## 2016-11-04 MED ORDER — VITAMIN C 500 MG PO TABS
500.0000 mg | ORAL_TABLET | Freq: Every day | ORAL | Status: DC
  Administered 2016-11-05 – 2016-11-06 (×2): 500 mg via ORAL
  Filled 2016-11-04 (×3): qty 1

## 2016-11-04 MED ORDER — LEVOTHYROXINE SODIUM 50 MCG PO TABS
50.0000 ug | ORAL_TABLET | Freq: Every day | ORAL | Status: DC
  Administered 2016-11-05 – 2016-11-07 (×3): 50 ug via ORAL
  Filled 2016-11-04 (×3): qty 1

## 2016-11-04 MED ORDER — ACETAMINOPHEN 160 MG/5ML PO SOLN
ORAL | Status: AC
  Administered 2016-11-04: 500 mg via ORAL
  Filled 2016-11-04: qty 20.3

## 2016-11-04 MED ORDER — NYSTATIN 100000 UNIT/ML MT SUSP
5.0000 mL | Freq: Four times a day (QID) | OROMUCOSAL | Status: DC
  Administered 2016-11-04 – 2016-11-07 (×3): 500000 [IU] via ORAL
  Filled 2016-11-04 (×7): qty 5

## 2016-11-04 MED ORDER — HYDROMORPHONE HCL 1 MG/ML IJ SOLN
0.2500 mg | INTRAMUSCULAR | Status: DC | PRN
  Administered 2016-11-04 (×2): 0.5 mg via INTRAVENOUS

## 2016-11-04 MED ORDER — CEFAZOLIN SODIUM-DEXTROSE 2-4 GM/100ML-% IV SOLN
2.0000 g | INTRAVENOUS | Status: AC
  Administered 2016-11-04: 2 g via INTRAVENOUS

## 2016-11-04 MED ORDER — ONDANSETRON HCL 4 MG/2ML IJ SOLN
4.0000 mg | Freq: Four times a day (QID) | INTRAMUSCULAR | Status: DC | PRN
  Administered 2016-11-05: 4 mg via INTRAVENOUS
  Filled 2016-11-04: qty 2

## 2016-11-04 MED ORDER — CALCIUM CARBONATE ANTACID 500 MG PO CHEW
1.0000 | CHEWABLE_TABLET | Freq: Every day | ORAL | Status: DC
  Administered 2016-11-05: 200 mg via ORAL
  Filled 2016-11-04 (×3): qty 1

## 2016-11-04 MED ORDER — ONDANSETRON HCL 4 MG PO TABS: 4.0000 mg | ORAL_TABLET | Freq: Four times a day (QID) | ORAL | Status: DC | PRN

## 2016-11-04 MED ORDER — PHENOL 1.4 % MT LIQD: 1.0000 | OROMUCOSAL | Status: DC | PRN

## 2016-11-04 MED ORDER — DOCUSATE SODIUM 100 MG PO CAPS
100.0000 mg | ORAL_CAPSULE | Freq: Two times a day (BID) | ORAL | Status: DC
  Administered 2016-11-04 – 2016-11-07 (×6): 100 mg via ORAL
  Filled 2016-11-04 (×6): qty 1

## 2016-11-04 MED ORDER — LIDOCAINE 2% (20 MG/ML) 5 ML SYRINGE
INTRAMUSCULAR | Status: AC
  Filled 2016-11-04: qty 5

## 2016-11-04 MED ORDER — ACETAMINOPHEN 325 MG PO TABS: 650.0000 mg | ORAL_TABLET | Freq: Four times a day (QID) | ORAL | Status: DC | PRN

## 2016-11-04 MED ORDER — PROPOFOL 500 MG/50ML IV EMUL
INTRAVENOUS | Status: DC | PRN
  Administered 2016-11-04: 50 ug/kg/min via INTRAVENOUS

## 2016-11-04 MED ORDER — HYDROMORPHONE HCL 1 MG/ML IJ SOLN
0.5000 mg | INTRAMUSCULAR | Status: DC | PRN
  Administered 2016-11-04 – 2016-11-06 (×4): 0.5 mg via INTRAVENOUS
  Filled 2016-11-04 (×4): qty 0.5

## 2016-11-04 MED ORDER — HYDROMORPHONE HCL 1 MG/ML IJ SOLN
INTRAMUSCULAR | Status: AC
  Administered 2016-11-04: 0.5 mg via INTRAVENOUS
  Filled 2016-11-04: qty 1

## 2016-11-04 MED ORDER — PHENYLEPHRINE 40 MCG/ML (10ML) SYRINGE FOR IV PUSH (FOR BLOOD PRESSURE SUPPORT)
PREFILLED_SYRINGE | INTRAVENOUS | Status: AC
  Filled 2016-11-04: qty 10

## 2016-11-04 MED ORDER — MAGNESIUM OXIDE 400 (241.3 MG) MG PO TABS
200.0000 mg | ORAL_TABLET | Freq: Every day | ORAL | Status: DC
  Administered 2016-11-05 – 2016-11-07 (×3): 200 mg via ORAL
  Filled 2016-11-04 (×3): qty 1

## 2016-11-04 MED ORDER — ALUM & MAG HYDROXIDE-SIMETH 200-200-20 MG/5ML PO SUSP: 30.0000 mL | ORAL | Status: DC | PRN

## 2016-11-04 MED ORDER — 0.9 % SODIUM CHLORIDE (POUR BTL) OPTIME
TOPICAL | Status: DC | PRN
  Administered 2016-11-04: 1000 mL

## 2016-11-04 MED ORDER — METHOCARBAMOL 500 MG PO TABS
500.0000 mg | ORAL_TABLET | Freq: Four times a day (QID) | ORAL | Status: DC | PRN
  Administered 2016-11-04 – 2016-11-07 (×8): 500 mg via ORAL
  Filled 2016-11-04 (×8): qty 1

## 2016-11-04 MED ORDER — ACETAMINOPHEN 650 MG RE SUPP: 650.0000 mg | Freq: Four times a day (QID) | RECTAL | Status: DC | PRN

## 2016-11-04 MED ORDER — ONDANSETRON HCL 4 MG/2ML IJ SOLN: 4.0000 mg | Freq: Once | INTRAMUSCULAR | Status: DC | PRN

## 2016-11-04 MED ORDER — METOCLOPRAMIDE HCL 5 MG PO TABS: 5.0000 mg | ORAL_TABLET | Freq: Three times a day (TID) | ORAL | Status: DC | PRN

## 2016-11-04 MED ORDER — ASPIRIN EC 325 MG PO TBEC
325.0000 mg | DELAYED_RELEASE_TABLET | Freq: Every day | ORAL | Status: DC
  Administered 2016-11-05 – 2016-11-07 (×3): 325 mg via ORAL
  Filled 2016-11-04 (×3): qty 1

## 2016-11-04 MED ORDER — PROPOFOL 10 MG/ML IV BOLUS
INTRAVENOUS | Status: DC | PRN
  Administered 2016-11-04 (×4): 10 mg via INTRAVENOUS

## 2016-11-04 MED ORDER — BUPIVACAINE IN DEXTROSE 0.75-8.25 % IT SOLN
INTRATHECAL | Status: DC | PRN
  Administered 2016-11-04: 1.6 mL via INTRATHECAL

## 2016-11-04 MED ORDER — MENTHOL 3 MG MT LOZG: 1.0000 | LOZENGE | OROMUCOSAL | Status: DC | PRN

## 2016-11-04 MED ORDER — LACTATED RINGERS IV SOLN
INTRAVENOUS | Status: DC
  Administered 2016-11-04: 1000 mL via INTRAVENOUS
  Administered 2016-11-04: 15:00:00 via INTRAVENOUS

## 2016-11-04 MED ORDER — PROPOFOL 10 MG/ML IV BOLUS
INTRAVENOUS | Status: AC
  Filled 2016-11-04: qty 40

## 2016-11-04 MED ORDER — HYDROCODONE-ACETAMINOPHEN 5-325 MG PO TABS
1.0000 | ORAL_TABLET | ORAL | Status: DC | PRN
  Administered 2016-11-04: 1 via ORAL
  Administered 2016-11-04 – 2016-11-07 (×11): 2 via ORAL
  Filled 2016-11-04 (×10): qty 2
  Filled 2016-11-04: qty 1
  Filled 2016-11-04 (×4): qty 2

## 2016-11-04 MED ORDER — SODIUM CHLORIDE 0.9 % IR SOLN
Status: DC | PRN
  Administered 2016-11-04: 1000 mL

## 2016-11-04 MED ORDER — CALCIUM-MAGNESIUM 500-250 MG PO TABS: 1.0000 | ORAL_TABLET | Freq: Every day | ORAL | Status: DC

## 2016-11-04 MED ORDER — DIPHENHYDRAMINE HCL 12.5 MG/5ML PO ELIX: 12.5000 mg | ORAL_SOLUTION | ORAL | Status: DC | PRN

## 2016-11-04 MED ORDER — ONDANSETRON HCL 4 MG/2ML IJ SOLN
INTRAMUSCULAR | Status: DC | PRN
  Administered 2016-11-04: 4 mg via INTRAVENOUS

## 2016-11-04 MED ORDER — ADULT MULTIVITAMIN W/MINERALS CH
1.0000 | ORAL_TABLET | Freq: Every day | ORAL | Status: DC
  Administered 2016-11-05 – 2016-11-06 (×2): 1 via ORAL
  Filled 2016-11-04 (×3): qty 1

## 2016-11-04 MED ORDER — DEXTROSE 5 % IV SOLN
500.0000 mg | Freq: Four times a day (QID) | INTRAVENOUS | Status: DC | PRN
  Administered 2016-11-04: 500 mg via INTRAVENOUS
  Filled 2016-11-04: qty 5
  Filled 2016-11-04: qty 550

## 2016-11-04 MED ORDER — AMLODIPINE BESYLATE 5 MG PO TABS
5.0000 mg | ORAL_TABLET | Freq: Every day | ORAL | Status: DC
  Administered 2016-11-05 – 2016-11-07 (×3): 5 mg via ORAL
  Filled 2016-11-04 (×3): qty 1

## 2016-11-04 MED ORDER — CEFAZOLIN SODIUM-DEXTROSE 2-4 GM/100ML-% IV SOLN
INTRAVENOUS | Status: AC
  Filled 2016-11-04: qty 100

## 2016-11-04 MED ORDER — PHENYLEPHRINE 40 MCG/ML (10ML) SYRINGE FOR IV PUSH (FOR BLOOD PRESSURE SUPPORT)
PREFILLED_SYRINGE | INTRAVENOUS | Status: DC | PRN
  Administered 2016-11-04 (×4): 80 ug via INTRAVENOUS
  Administered 2016-11-04 (×2): 40 ug via INTRAVENOUS

## 2016-11-04 MED ORDER — MEPERIDINE HCL 50 MG/ML IJ SOLN: 6.2500 mg | INTRAMUSCULAR | Status: DC | PRN

## 2016-11-04 MED ORDER — CEFAZOLIN IN D5W 1 GM/50ML IV SOLN
1.0000 g | Freq: Four times a day (QID) | INTRAVENOUS | Status: AC
  Administered 2016-11-04 – 2016-11-05 (×2): 1 g via INTRAVENOUS
  Filled 2016-11-04 (×2): qty 50

## 2016-11-04 MED ORDER — FENTANYL CITRATE (PF) 100 MCG/2ML IJ SOLN
INTRAMUSCULAR | Status: AC
  Filled 2016-11-04: qty 2

## 2016-11-04 MED ORDER — ONDANSETRON HCL 4 MG/2ML IJ SOLN
INTRAMUSCULAR | Status: AC
  Filled 2016-11-04: qty 2

## 2016-11-04 MED ORDER — CHOLECALCIFEROL 10 MCG (400 UNIT) PO TABS
400.0000 [IU] | ORAL_TABLET | Freq: Every day | ORAL | Status: DC
  Administered 2016-11-05 – 2016-11-06 (×2): 400 [IU] via ORAL
  Filled 2016-11-04 (×4): qty 1

## 2016-11-04 MED ORDER — LIDOCAINE 2% (20 MG/ML) 5 ML SYRINGE
INTRAMUSCULAR | Status: DC | PRN
  Administered 2016-11-04: 20 mg via INTRAVENOUS

## 2016-11-04 MED ORDER — FENTANYL CITRATE (PF) 100 MCG/2ML IJ SOLN
INTRAMUSCULAR | Status: AC
  Administered 2016-11-04: 50 ug via INTRAVENOUS
  Filled 2016-11-04: qty 2

## 2016-11-04 MED ORDER — IRBESARTAN 150 MG PO TABS
150.0000 mg | ORAL_TABLET | Freq: Every day | ORAL | Status: DC
  Administered 2016-11-05 – 2016-11-07 (×3): 150 mg via ORAL
  Filled 2016-11-04 (×3): qty 1

## 2016-11-04 MED ORDER — SIMVASTATIN 20 MG PO TABS
10.0000 mg | ORAL_TABLET | Freq: Every day | ORAL | Status: DC
  Administered 2016-11-05 – 2016-11-06 (×2): 10 mg via ORAL
  Filled 2016-11-04 (×2): qty 1

## 2016-11-04 MED ORDER — METOCLOPRAMIDE HCL 5 MG/ML IJ SOLN: 5.0000 mg | Freq: Three times a day (TID) | INTRAMUSCULAR | Status: DC | PRN

## 2016-11-04 SURGICAL SUPPLY — 36 items
BAG ZIPLOCK 12X15 (MISCELLANEOUS) ×2 IMPLANT
BENZOIN TINCTURE PRP APPL 2/3 (GAUZE/BANDAGES/DRESSINGS) ×2 IMPLANT
BLADE SAW SGTL 18X1.27X75 (BLADE) ×2 IMPLANT
CAPT HIP TOTAL 2 ×2 IMPLANT
CELLS DAT CNTRL 66122 CELL SVR (MISCELLANEOUS) ×1 IMPLANT
CLOTH BEACON ORANGE TIMEOUT ST (SAFETY) ×2 IMPLANT
COVER PERINEAL POST (MISCELLANEOUS) ×2 IMPLANT
DRAPE STERI IOBAN 125X83 (DRAPES) ×2 IMPLANT
DRAPE U-SHAPE 47X51 STRL (DRAPES) ×4 IMPLANT
DRSG AQUACEL AG ADV 3.5X10 (GAUZE/BANDAGES/DRESSINGS) ×2 IMPLANT
DURAPREP 26ML APPLICATOR (WOUND CARE) ×2 IMPLANT
ELECT REM PT RETURN 9FT ADLT (ELECTROSURGICAL) ×2
ELECTRODE REM PT RTRN 9FT ADLT (ELECTROSURGICAL) ×1 IMPLANT
GAUZE XEROFORM 1X8 LF (GAUZE/BANDAGES/DRESSINGS) IMPLANT
GLOVE BIO SURGEON STRL SZ7.5 (GLOVE) ×2 IMPLANT
GLOVE BIOGEL PI IND STRL 8 (GLOVE) ×2 IMPLANT
GLOVE BIOGEL PI INDICATOR 8 (GLOVE) ×2
GLOVE ECLIPSE 8.0 STRL XLNG CF (GLOVE) ×2 IMPLANT
GOWN STRL REUS W/TWL XL LVL3 (GOWN DISPOSABLE) ×4 IMPLANT
HANDPIECE INTERPULSE COAX TIP (DISPOSABLE) ×1
HOLDER FOLEY CATH W/STRAP (MISCELLANEOUS) ×2 IMPLANT
PACK ANTERIOR HIP CUSTOM (KITS) ×2 IMPLANT
PIN SECTOR W/GRIP ACE CUP 52MM (Hips) ×2 IMPLANT
RTRCTR WOUND ALEXIS 18CM MED (MISCELLANEOUS) ×2
SET HNDPC FAN SPRY TIP SCT (DISPOSABLE) ×1 IMPLANT
STAPLER VISISTAT 35W (STAPLE) IMPLANT
STRIP CLOSURE SKIN 1/2X4 (GAUZE/BANDAGES/DRESSINGS) ×2 IMPLANT
SUT ETHIBOND NAB CT1 #1 30IN (SUTURE) ×2 IMPLANT
SUT MNCRL AB 4-0 PS2 18 (SUTURE) ×2 IMPLANT
SUT VIC AB 0 CT1 36 (SUTURE) ×2 IMPLANT
SUT VIC AB 1 CT1 36 (SUTURE) ×2 IMPLANT
SUT VIC AB 2-0 CT1 27 (SUTURE) ×2
SUT VIC AB 2-0 CT1 TAPERPNT 27 (SUTURE) ×2 IMPLANT
TRAY FOLEY CATH 14FRSI W/METER (CATHETERS) ×2 IMPLANT
WATER STERILE IRR 1000ML POUR (IV SOLUTION) ×4 IMPLANT
YANKAUER SUCT BULB TIP 10FT TU (MISCELLANEOUS) ×2 IMPLANT

## 2016-11-04 NOTE — Progress Notes (Signed)
PHARMACIST - PHYSICIAN ORDER COMMUNICATION  CONCERNING: P&T Medication Policy on Herbal Medications  DESCRIPTION:  This patient's order for: Co-enzyme Q-10  has been noted.  This product(s) is classified as an "herbal" or natural product. Due to a lack of definitive safety studies or FDA approval, nonstandard manufacturing practices, plus the potential risk of unknown drug-drug interactions while on inpatient medications, the Pharmacy and Therapeutics Committee does not permit the use of "herbal" or natural products of this type within [facility].   ACTION TAKEN: The pharmacy department is unable to verify this order at this time and your patient has been informed of this safety policy. Please reevaluate patient's clinical condition at discharge and address if the herbal or natural product(s) should be resumed at that time.   Lindell Spar, PharmD, BCPS Pager: [phone] 11/04/2016 5:51 PM

## 2016-11-04 NOTE — Transfer of Care (Signed)
Immediate Anesthesia Transfer of Care Note  Patient: Tanya Morales  Procedure(s) Performed: Procedure(s): LEFT TOTAL HIP ARTHROPLASTY ANTERIOR APPROACH (Left)  Patient Location: PACU  Anesthesia Type:Spinal  Level of Consciousness:  sedated, patient cooperative and responds to stimulation  Airway & Oxygen Therapy:Patient Spontanous Breathing and Patient connected to face mask oxgen  Post-op Assessment:  Report given to PACU RN and Post -op Vital signs reviewed and stable  Post vital signs:  Reviewed and stable  Last Vitals:  Vitals:   11/04/16 1230  BP: (!) 158/58  Pulse: 86  Resp: 18  Temp: [id number] C    Complications: No apparent anesthesia complications

## 2016-11-04 NOTE — Brief Op Note (Signed)
11/04/2016  3:18 PM  PATIENT:  Tanya Morales  83 y.o. female  PRE-OPERATIVE DIAGNOSIS:  osteoarthritis left hip  POST-OPERATIVE DIAGNOSIS:  osteoarthritis left hip  PROCEDURE:  Procedure(s): LEFT TOTAL HIP ARTHROPLASTY ANTERIOR APPROACH (Left)  SURGEON:  Surgeon(s) and Role:    * Mcarthur Rossetti, MD - Primary  PHYSICIAN ASSISTANT: Benita Stabile, PA-C  ANESTHESIA:   spinal  EBL:  Total I/O In: 1000 [I.V.:1000] Out: 351 [Urine:101; Blood:250]  COUNTS:  YES  DICTATION: .Other Dictation: Dictation Number [id number]  PLAN OF CARE: Admit to inpatient   PATIENT DISPOSITION:  PACU - hemodynamically stable.   Delay start of Pharmacological VTE agent (>24hrs) due to surgical blood loss or risk of bleeding: no

## 2016-11-04 NOTE — Anesthesia Procedure Notes (Signed)
Spinal  Start time: 11/04/2016 1:50 PM End time: 11/04/2016 1:55 PM Staffing Anesthesiologist: Lyndle Herrlich Resident/CRNA: Darlys Gales R Performed: resident/CRNA  Preanesthetic Checklist Completed: patient identified, site marked, surgical consent, pre-op evaluation, timeout performed, IV checked, risks and benefits discussed and monitors and equipment checked Spinal Block Patient position: sitting Prep: DuraPrep Patient monitoring: heart rate, continuous pulse ox and blood pressure Approach: midline Location: L2-3 Injection technique: single-shot Needle Needle type: Sprotte  Needle gauge: 24 G Needle length: 9 cm Needle insertion depth: 7 cm Assessment Sensory level: T6

## 2016-11-04 NOTE — H&P (Signed)
TOTAL HIP ADMISSION H&P  Patient is admitted for left total hip arthroplasty.  Subjective:  Chief Complaint: left hip pain  HPI: Tanya Morales, 83 y.o. female, has a history of pain and functional disability in the left hip(s) due to arthritis and patient has failed non-surgical conservative treatments for greater than 12 weeks to include NSAID's and/or analgesics, corticosteriod injections, use of assistive devices and activity modification.  Onset of symptoms was abrupt starting 1 years ago with gradually worsening course since that time.The patient noted no past surgery on the left hip(s).  Patient currently rates pain in the left hip at 10 out of 10 with activity. Patient has night pain, worsening of pain with activity and weight bearing, trendelenberg gait, pain that interfers with activities of daily living, pain with passive range of motion and crepitus. Patient has evidence of subchondral cysts, subchondral sclerosis, periarticular osteophytes and joint space narrowing by imaging studies. This condition presents safety issues increasing the risk of falls.  There is no current active infection.  Patient Active Problem List   Diagnosis Date Noted  . Abnormal nuclear stress test   . Arthritis of midfoot 05/24/2016  . Osteoarthritis of left hip 04/21/2016  . Neck pain 11/04/2014  . Midline low back pain without sciatica 11/04/2014  . Breast cancer, stage 1 ([place]) 03/14/2011  . Postop check 03/14/2011   Past Medical History:  Diagnosis Date  . Arthritis    OA  . Breast cancer, stage 1 ([place]) 03/14/2011   left BREAST, AND 4 WEEKS RADIATION  . Chronic back pain    LOWER  . Heart murmur   . Hyperlipidemia   . Hypertension   . Hypothyroidism   . Raynaud disease   . Spinal headache 61 YRS AGO AFTER CHILDBIRTH  . TIA (transient ischemic attack) march 2012    Past Surgical History:  Procedure Laterality Date  . BREAST SURGERY Left 02/23/11   left lumpectomy  . CARDIAC  CATHETERIZATION N/A 10/12/2016   Procedure: Left Heart Cath and Coronary Angiography;  Surgeon: Jettie Booze, MD;  Location: [facility];  Service: Cardiovascular;  Laterality: N/A;  . COLONSCOPY  2009  . EYE SURGERY Bilateral 2013   IOC LENS FOR CATARACTS  . FINGER SURGERY    . TONSILLECTOMY AND ADENOIDECTOMY    . TOTAL ABDOMINAL HYSTERECTOMY  1974   COMPLETE    No prescriptions prior to admission.   Allergies  Allergen Reactions  . Celebrex [Celecoxib]     COULD NOT BREATHE  . Epinephrine     COULD NOT STAND, FELT BAD    Social History  Substance Use Topics  . Smoking status: Former Smoker    Types: Cigarettes    Quit date: 09/20/1967  . Smokeless tobacco: Never Used     Comment: quit 1969  . Alcohol use No    Family History  Problem Relation Age of Onset  . Congestive Heart Failure Mother   . Cancer Maternal Grandmother   . Rheum arthritis Sister   . Healthy Sister   . Other Son     CHRONIC INFLAMMATORY DEMYELINATING POLYNEUROPATHY  . Healthy Son      Review of Systems  Musculoskeletal: Positive for joint pain.  All other systems reviewed and are negative.   Objective:  Physical Exam  Constitutional: [name], place, and time. She appears well-developed and well-nourished.  HENT:  Head: Normocephalic and atraumatic.  Eyes: EOM are normal. Pupils are equal, round, and reactive to  light.  Neck: Normal range of motion. Neck supple.  Cardiovascular: Normal rate and regular rhythm.   Respiratory: Effort normal and breath sounds normal.  GI: Soft. Bowel sounds are normal.  Musculoskeletal:       Left hip: She exhibits decreased range of motion, decreased strength, tenderness and bony tenderness.  Neurological: She is alert and oriented to person, place, and time.  Skin: Skin is warm and dry.  Psychiatric: She has a normal mood and affect.    Vital signs in last 24 hours:    Labs:   Estimated body mass index is 21.16 kg/m as  calculated from the following:   Height as of 10/31/16: 5\' 1"  (1.549 m).   Weight as of 10/31/16: 112 lb (50.8 kg).   Imaging Review Plain radiographs demonstrate severe degenerative joint disease of the left hip(s). The bone quality appears to be good for age and reported activity level.  Assessment/Plan:  End stage arthritis, left hip(s)  The patient history, physical examination, clinical judgement of the provider and imaging studies are consistent with end stage degenerative joint disease of the left hip(s) and total hip arthroplasty is deemed medically necessary. The treatment options including medical management, injection therapy, arthroscopy and arthroplasty were discussed at length. The risks and benefits of total hip arthroplasty were presented and reviewed. The risks due to aseptic loosening, infection, stiffness, dislocation/subluxation,  thromboembolic complications and other imponderables were discussed.  The patient acknowledged the explanation, agreed to proceed with the plan and consent was signed. Patient is being admitted for inpatient treatment for surgery, pain control, PT, OT, prophylactic antibiotics, VTE prophylaxis, progressive ambulation and ADL's and discharge planning.The patient is planning to be discharged to skilled nursing facility

## 2016-11-04 NOTE — Anesthesia Preprocedure Evaluation (Addendum)
Anesthesia Evaluation  Patient identified by MRN, date of birth, ID band Patient awake    Reviewed: Allergy & Precautions, H&P , Patient's Chart, lab work & pertinent test results, reviewed documented beta blocker date and time   Airway Mallampati: II  TM Distance: >3 FB Neck ROM: full    Dental no notable dental hx.    Pulmonary former smoker,    Pulmonary exam normal breath sounds clear to auscultation       Cardiovascular hypertension,  Rhythm:regular Rate:Normal     Neuro/Psych    GI/Hepatic   Endo/Other    Renal/GU      Musculoskeletal   Abdominal   Peds  Hematology   Anesthesia Other Findings Hx of TIA Recent cardiac w/u   The left ventricular systolic function is normal.  LV end diastolic pressure is normal.  EF 55-65%   There is no aortic valve stenosis.     Mild disease.  No significant obstructive disease present.      Reproductive/Obstetrics                             Anesthesia Physical Anesthesia Plan  ASA: III  Anesthesia Plan: Spinal   Post-op Pain Management:    Induction:   Airway Management Planned:   Additional Equipment:   Intra-op Plan:   Post-operative Plan:   Informed Consent: I have reviewed the patients History and Physical, chart, labs and discussed the procedure including the risks, benefits and alternatives for the proposed anesthesia with the patient or authorized representative who has indicated his/her understanding and acceptance.   Dental Advisory Given  Plan Discussed with: CRNA and Surgeon  Anesthesia Plan Comments: (Lab work and procedure  confirmed with CRNA in room; platelets okay. Discussed spinal anesthetic, and patient consents to the procedure:  included risk of possible headache,backache, failed block, allergic reaction, and nerve injury. This patient was asked if she had any questions or concerns before the procedure  started.  )       Anesthesia Quick Evaluation

## 2016-11-05 LAB — CBC
HCT: 31.7 % — ABNORMAL LOW (ref 36.0–46.0)
Hemoglobin: 10.6 g/dL — ABNORMAL LOW (ref 12.0–15.0)
MCH: 31.1 pg (ref 26.0–34.0)
MCHC: 33.4 g/dL (ref 30.0–36.0)
MCV: 93 fL (ref 78.0–100.0)
Platelets: 203 10*3/uL (ref 150–400)
RBC: 3.41 MIL/uL — ABNORMAL LOW (ref 3.87–5.11)
RDW: 12.9 % (ref 11.5–15.5)
WBC: 6.5 10*3/uL (ref 4.0–10.5)

## 2016-11-05 LAB — BASIC METABOLIC PANEL
BUN: 5 mg/dL — ABNORMAL LOW (ref 6–20)
CO2: 31 mmol/L (ref 22–32)
Calcium: 8.2 mg/dL — ABNORMAL LOW (ref 8.9–10.3)
Chloride: 103 mmol/L (ref 101–111)
Creatinine, Ser: 0.47 mg/dL (ref 0.44–1.00)
GLUCOSE: 133 mg/dL — AB (ref 65–99)
Potassium: 4 mmol/L (ref 3.5–5.1)
SODIUM: 136 mmol/L (ref 135–145)

## 2016-11-05 MED ORDER — LIP MEDEX EX OINT
TOPICAL_OINTMENT | CUTANEOUS | Status: AC
  Administered 2016-11-05: 12:00:00
  Filled 2016-11-05: qty 7

## 2016-11-05 NOTE — Progress Notes (Signed)
OT Cancellation Note  Patient Details Name: Tanya Morales MRN: [id number] DOB: 11-Jun-1929   Cancelled Treatment:    Reason Eval/Treat Not Completed: Other (comment) Will defer OT eval to SNF.  Pauline Aus [place] 11/05/2016, 12:31 PM

## 2016-11-05 NOTE — Evaluation (Signed)
Physical Therapy Evaluation Patient Details Name: Tanya Morales MRN: [id number] DOB: 1929/06/23 Today's Date: 11/05/2016   History of Present Illness  Tanya Morales  Clinical Impression  The patient requires much extra time to mobilize with 2 person assist. Complains of painful left foot to touch. Able to bear weight on the left to ambulate x 6'. Pt admitted with above diagnosis. Pt currently with functional limitations due to the deficits listed below (see PT Problem List). Pt will benefit from skilled PT to increase their independence and safety with mobility to allow discharge to the venue listed below.       Follow Up Recommendations SNF    Equipment Recommendations  None recommended by PT    Recommendations for Other Services       Precautions / Restrictions Precautions Precautions: Fall Precaution Comments: don't touch the right ankle Restrictions Weight Bearing Restrictions: No      Mobility  Bed Mobility Overal bed mobility: Needs Assistance Bed Mobility: Supine to Sit     Supine to sit: Max assist;+2 for physical assistance;+2 for safety/equipment     General bed mobility comments: assist for legs and trunk, patient reports left foot painful to touch. much extra time to mobilize  Transfers Overall transfer level: Needs assistance Equipment used: Rolling walker (2 wheeled) Transfers: Sit to/from Stand Sit to Stand: Max assist;+2 physical assistance;+2 safety/equipment         General transfer comment: cues for hand position, assist to rise, encouraged  weight on the left foot.  Ambulation/Gait Ambulation/Gait assistance: Mod assist;+2 physical assistance;+2 safety/equipment Ambulation Distance (Feet): 6 Feet Assistive device: Rolling walker (2 wheeled) Gait Pattern/deviations: Step-to pattern;Antalgic     General Gait Details: multimodal cues for sequence and posture.  left leg tending to rotate inward.  Stairs            Wheelchair Mobility     Modified Rankin (Stroke Patients Only)       Balance                                             Pertinent Vitals/Pain Pain Assessment: 0-10 Pain Score: 10-Worst pain ever Pain Location: left thigh to foot Pain Descriptors / Indicators: Burning;Cramping;Crushing;Crying;Discomfort;Dull;Grimacing;Guarding;Restless Pain Intervention(s): Monitored during session;Premedicated before session;Repositioned;Ice applied    Home Living Family/patient expects to be discharged to:: Skilled nursing facility Living Arrangements: Non-relatives/Friends Available Help at Discharge: Personal care attendant Type of Home: House         Home Equipment: Gilford Rile - 2 wheels;Cane - single point Additional Comments: Has had  caregivers PTA    Prior Function Level of Independence: Independent with assistive device(s)         Comments: used a cane     Hand Dominance        Extremity/Trunk Assessment   Upper Extremity Assessment Upper Extremity Assessment: RUE deficits/detail RUE Deficits / Details: pt. reports that the shoulder dislocates.    Lower Extremity Assessment Lower Extremity Assessment: LLE deficits/detail LLE Deficits / Details: requires assistance to move the leg in bed, did place weight on the leg    Cervical / Trunk Assessment Cervical / Trunk Assessment: Kyphotic  Communication   Communication: No difficulties  Cognition Arousal/Alertness: Awake/alert Behavior During Therapy: Anxious Overall Cognitive Status: Within Functional Limits for tasks assessed  General Comments      Exercises     Assessment/Plan    PT Assessment Patient needs continued PT services  PT Problem List Decreased strength;Decreased range of motion;Decreased activity tolerance;Decreased balance;Decreased mobility;Decreased knowledge of precautions;Decreased safety awareness;Decreased knowledge of use of DME;Pain          PT Treatment  Interventions DME instruction;Gait training;Functional mobility training;Therapeutic activities;Therapeutic exercise;Patient/family education    PT Goals (Current goals can be found in the Care Plan section)  Acute Rehab PT Goals Patient Stated Goal: I am going to [facility] PT Goal Formulation: With patient Time For Goal Achievement: 11/12/16 Potential to Achieve Goals: Good    Frequency 7X/week   Barriers to discharge        Co-evaluation               End of Session Equipment Utilized During Treatment: Gait belt Activity Tolerance: Patient limited by fatigue;No increased pain Patient left: in chair;with call bell/phone within reach Nurse Communication: Mobility status         Time: [id number] PT Time Calculation (min) (ACUTE ONLY): 44 min   Charges:   PT Evaluation $PT Eval Moderate Complexity: 1 Procedure PT Treatments $Gait Training: 23-37 mins   PT G Codes:        Tanya Morales 11/05/2016, 1:18 PM  Tanya Morales PT [phone]

## 2016-11-05 NOTE — Progress Notes (Signed)
Physical Therapy Treatment Patient Details Name: Tanya Morales MRN: [id number] DOB: 1929/03/16 Today's Date: 11/05/2016    History of Present Illness L DATHA    PT Comments    Noted to have spasms left leg. RN notified for medication. Continue PT.  Follow Up Recommendations  SNF     Equipment Recommendations  None recommended by PT    Recommendations for Other Services       Precautions / Restrictions Precautions Precautions: Fall Precaution Comments: don't touch the right ankle Restrictions Weight Bearing Restrictions: No    Mobility  Bed Mobility Overal bed mobility: Needs Assistance Bed Mobility: Sit to Supine     Supine to sit: Max assist;+2 for physical assistance;+2 for safety/equipment Sit to supine: Total assist;+2 for physical assistance;+2 for safety/equipment   General bed mobility comments: assist the legs and trunk.  Transfers Overall transfer level: Needs assistance Equipment used: Rolling walker (2 wheeled) Transfers: Sit to/from [facility] Sit to Stand: Mod assist;+2 physical assistance;+2 safety/equipment Stand pivot transfers: Mod assist;+2 physical assistance;+2 safety/equipment       General transfer comment: cues for hand position, assist to rise, encouraged  weight on the left foot. Pivot steps to back up to the recliner.  Ambulation/Gait  Stairs            Wheelchair Mobility    Modified Rankin (Stroke Patients Only)       Balance                                    Cognition Arousal/Alertness: Awake/alert Behavior During Therapy: Anxious Overall Cognitive Status: Within Functional Limits for tasks assessed                      Exercises      General Comments        Pertinent Vitals/Pain Pain Assessment: 0-10 Pain Score: 10-Worst pain ever Pain Location: left thigh to foot Pain Descriptors / Indicators:  Burning;Cramping;Crushing;Crying;Discomfort;Dull;Grimacing;Guarding;Restless Pain Intervention(s): Monitored during session;Premedicated before session;Repositioned;Ice applied    Home Living Family/patient expects to be discharged to:: Skilled nursing facility Living Arrangements: Non-relatives/Friends Available Help at Discharge: Personal care attendant Type of Home: House       Home Equipment: Gilford Rile - 2 wheels;Cane - single point Additional Comments: Has had  caregivers PTA    Prior Function Level of Independence: Independent with assistive device(s)      Comments: used a cane   PT Goals (current goals can now be found in the care plan section) Acute Rehab PT Goals Patient Stated Goal: I am going to [facility] PT Goal Formulation: With patient Time For Goal Achievement: 11/12/16 Potential to Achieve Goals: Good Progress towards PT goals: Progressing toward goals    Frequency    7X/week      PT Plan Current plan remains appropriate    Co-evaluation             End of Session Equipment Utilized During Treatment: Gait belt Activity Tolerance: Patient limited by fatigue;No increased pain Patient left: with call bell/phone within reach;in bed;with nursing/sitter in room     Time: 1140-1156 PT Time Calculation (min) (ACUTE ONLY): 16 min  Charges:  $Therapeutic Activity: 8-22 mins                    G Codes:      [name] 11/05/2016, 1:22 PM [name] PT [phone]

## 2016-11-05 NOTE — Progress Notes (Signed)
Subjective: Patient doing somewhat well except she is having a lot of spasm in her left hip.  She has walked in the room however.   Objective: Vital signs in last 24 hours: Temp:  [97.7 F (36.5 C)-98.6 F (37 C)] 98.6 F (37 C) (02/17 1029) Pulse Rate:  [67-88] 88 (02/17 1029) Resp:  [11-18] 17 (02/17 1029) BP: (130-167)/(48-71) 130/57 (02/17 1029) SpO2:  [95 %-100 %] 95 % (02/17 1029) Weight:  [113 lb (51.3 kg)] 113 lb (51.3 kg) (02/16 1800)  Intake/Output from previous day: 02/16 0701 - 02/17 0700 In: 3950 [P.O.:720; I.V.:3075; IV Piggyback:155] Out: 2351 [Urine:2101; Blood:250] Intake/Output this shift: Total I/O In: 585 [P.O.:360; I.V.:225] Out: 600 [Urine:600]  Exam:  Dorsiflexion/Plantar flexion intact  Labs:  Recent Labs  11/05/16 0640  HGB 10.6*    Recent Labs  11/05/16 0640  WBC 6.5  RBC 3.41*  HCT 31.7*  PLT 203    Recent Labs  11/05/16 0640  NA 136  K 4.0  CL 103  CO2 31  BUN 5*  CREATININE 0.47  GLUCOSE 133*  CALCIUM 8.2*   No results for input(s): LABPT, INR in the last 72 hours.  Assessment/Plan: Patient is making slow progress.  Anticipate discharge to skilled nursing facility on Monday or Tuesday.  Continue with physical therapy over the weekend   [employer] 11/05/2016, 12:13 PM

## 2016-11-05 NOTE — Clinical Social Work Note (Addendum)
Clinical Social Work Assessment  Patient Details  Name: Tanya Morales MRN: [id number] Date of Birth: January 06, 1929  Date of referral:  11/05/16               Reason for consult:  Discharge Planning                Permission sought to share information with:  Family Supports, Customer service manager Permission granted to share information::  Yes, Verbal Permission Granted  Name::     Tanya Morales  Agency::     Relationship::  Son  Contact Information:  [phone]  Housing/Transportation Living arrangements for the past 2 months:  [place] of Information:  Patient Patient Interpreter Needed:  None Criminal Activity/Legal Involvement Pertinent to Current Situation/Hospitalization:  No - Comment as needed Significant Relationships:  Adult Children Lives with:  Self Do you feel safe going back to the place where you live?  Yes Need for family participation in patient care:  No (Coment)  Care giving concerns:  No caregiving concerns identified.    Social Worker assessment / plan:  CSW met with pt to address SNF placement for STR. CSW introduced self and explained social work Paramedic. Pt in agreement with discharge plan. Pt has already arranged room at [facility] for rehab. Pt lives alone and has an adult son, Tanya Morales, that she says lives in [place]. Pt stated she already confirmed with [facility] for rehab bed. CSW offered to call son, but pt stated she has already contacted him this morning. CSW confirmed STR bed at [facility] with [facility] (weekend admissions).  CSW will complete FL-2/PASRR and send to [facility]. CSW will continue to follow.   Employment status:  Retired Forensic scientist:  Medicare PT Recommendations:  [place] / Referral to community resources:  [place]  Patient/Family's Response to care:  Pt is supportive and appreciative of CSW support.   Patient/Family's  Understanding of and Emotional Response to Diagnosis, Current Treatment, and Prognosis:  Pt understands the benefits of short term rehab before returning home.   Emotional Assessment Appearance:  Appears stated age Attitude/Demeanor/Rapport:   (Appropriate) Affect (typically observed):  Accepting, Adaptable, Pleasant Orientation:  Oriented to Self, Oriented to Place, Oriented to  Time, Oriented to Situation Alcohol / Substance use:  Other Psych involvement (Current and /or in the community):  No (Comment)  Discharge Needs  Concerns to be addressed:  Care Coordination Readmission within the last 30 days:  No Current discharge risk:  Dependent with Mobility, Lives alone Barriers to Discharge:  Continued Medical Work up   [employer], LCSW 11/05/2016, 10:02 AM

## 2016-11-05 NOTE — NC FL2 (Signed)
[place] MEDICAID FL2 LEVEL OF CARE SCREENING TOOL     IDENTIFICATION  Patient Name: Tanya Morales Birthdate: 02/07/1929 Sex: female Admission Date (Current Location): 11/04/2016  [facility] and [place] Number:  Herbalist and Address:  [facility],  [address] [place], [place]      Provider Number: [id number]  Attending Physician Name and Address:  Mcarthur Rossetti, *  Relative Name and Phone Number:       Current Level of Care: Hospital Recommended Level of Care: [place] Prior Approval Number:    Date Approved/Denied:   PASRR Number: [id number]  Discharge Plan: SNF    Current Diagnoses: Patient Active Problem List   Diagnosis Date Noted  . Status post left hip replacement 11/04/2016  . Abnormal nuclear stress test   . Arthritis of midfoot 05/24/2016  . Osteoarthritis of left hip 04/21/2016  . Neck pain 11/04/2014  . Midline low back pain without sciatica 11/04/2014  . Breast cancer, stage 1 ([place]) 03/14/2011  . Postop check 03/14/2011    Orientation RESPIRATION BLADDER Height & Weight     Self, Time, Situation, Place  O2 (2L) Continent Weight: 113 lb (51.3 kg) Height:  5\' 1"  (154.9 cm)  BEHAVIORAL SYMPTOMS/MOOD NEUROLOGICAL BOWEL NUTRITION STATUS      Continent Diet (Regular diet; thin liquids)  AMBULATORY STATUS COMMUNICATION OF NEEDS Skin   Extensive Assist Verbally Normal                       Personal Care Assistance Level of Assistance  Bathing, Feeding, Dressing Bathing Assistance: Limited assistance Feeding assistance: Independent Dressing Assistance: Limited assistance     Functional Limitations Info  Sight, Hearing, Speech Sight Info: Adequate Hearing Info: Adequate Speech Info: Adequate    SPECIAL CARE FACTORS FREQUENCY  PT (By licensed PT), OT (By licensed OT)     PT Frequency: 5x OT Frequency: 5x            Contractures Contractures Info: Not present    Additional  Factors Info  Code Status, Allergies Code Status Info: Full Allergies Info: Celebrex Celecoxib, Epinephrine           Current Medications (11/05/2016):  This is the current hospital active medication list Current Facility-Administered Medications  Medication Dose Route Frequency Provider Last Rate Last Dose  . 0.9 %  sodium chloride infusion   Intravenous Continuous Mcarthur Rossetti, MD 75 mL/hr at 11/05/16 0541    . acetaminophen (TYLENOL) tablet 650 mg  650 mg Oral Q6H PRN Mcarthur Rossetti, MD       Or  . acetaminophen (TYLENOL) suppository 650 mg  650 mg Rectal Q6H PRN Mcarthur Rossetti, MD      . alum & mag hydroxide-simeth (MAALOX/MYLANTA) 200-200-20 MG/5ML suspension 30 mL  30 mL Oral Q4H PRN Mcarthur Rossetti, MD      . amLODipine (NORVASC) tablet 5 mg  5 mg Oral Daily Mcarthur Rossetti, MD   5 mg at 11/05/16 0835  . aspirin EC tablet 325 mg  325 mg Oral Q breakfast Mcarthur Rossetti, MD   325 mg at 11/05/16 0835  . calcium carbonate (TUMS - dosed in mg elemental calcium) chewable tablet 200 mg of elemental calcium  1 tablet Oral Daily Mcarthur Rossetti, MD   200 mg of elemental calcium at 11/05/16 0834   And  . magnesium oxide (MAG-OX) tablet 200 mg  200 mg Oral Daily Lind Guest  Ninfa Linden, MD   200 mg at 11/05/16 0835  . cholecalciferol (VITAMIN D) tablet 400 Units  400 Units Oral Daily Mcarthur Rossetti, MD   400 Units at 11/05/16 1000  . diphenhydrAMINE (BENADRYL) 12.5 MG/5ML elixir 12.5-25 mg  12.5-25 mg Oral Q4H PRN Mcarthur Rossetti, MD      . docusate sodium (COLACE) capsule 100 mg  100 mg Oral BID Mcarthur Rossetti, MD   100 mg at 11/05/16 0835  . HYDROcodone-acetaminophen (NORCO/VICODIN) 5-325 MG per tablet 1-2 tablet  1-2 tablet Oral Q4H PRN Mcarthur Rossetti, MD   2 tablet at 11/05/16 [phone]  . HYDROmorphone (DILAUDID) injection 0.5 mg  0.5 mg Intravenous Q2H PRN Mcarthur Rossetti, MD   0.5 mg at 11/05/16 0231  .  irbesartan (AVAPRO) tablet 150 mg  150 mg Oral Daily Mcarthur Rossetti, MD   150 mg at 11/05/16 0835  . levothyroxine (SYNTHROID, LEVOTHROID) tablet 50 mcg  50 mcg Oral QAC breakfast Mcarthur Rossetti, MD   50 mcg at 11/05/16 [phone]  . menthol-cetylpyridinium (CEPACOL) lozenge 3 mg  1 lozenge Oral PRN Mcarthur Rossetti, MD       Or  . phenol (CHLORASEPTIC) mouth spray 1 spray  1 spray Mouth/Throat PRN Mcarthur Rossetti, MD      . methocarbamol (ROBAXIN) tablet 500 mg  500 mg Oral Q6H PRN Mcarthur Rossetti, MD   500 mg at 11/05/16 [id number]   Or  . methocarbamol (ROBAXIN) 500 mg in dextrose 5 % 50 mL IVPB  500 mg Intravenous Q6H PRN Mcarthur Rossetti, MD   500 mg at 11/04/16 1553  . metoCLOPramide (REGLAN) tablet 5-10 mg  5-10 mg Oral Q8H PRN Mcarthur Rossetti, MD       Or  . metoCLOPramide (REGLAN) injection 5-10 mg  5-10 mg Intravenous Q8H PRN Mcarthur Rossetti, MD      . multivitamin with minerals tablet 1 tablet  1 tablet Oral Daily Mcarthur Rossetti, MD   1 tablet at 11/05/16 [phone]  . nystatin (MYCOSTATIN) 100000 UNIT/ML suspension 500,000 Units  5 mL Oral QID Mcarthur Rossetti, MD   500,000 Units at 11/05/16 [phone]  . ondansetron (ZOFRAN) tablet 4 mg  4 mg Oral Q6H PRN Mcarthur Rossetti, MD       Or  . ondansetron [facility]) injection 4 mg  4 mg Intravenous Q6H PRN Mcarthur Rossetti, MD   4 mg at 11/05/16 0834  . simvastatin (ZOCOR) tablet 10 mg  10 mg Oral QHS Mcarthur Rossetti, MD      . vitamin C (ASCORBIC ACID) tablet 500 mg  500 mg Oral Daily Mcarthur Rossetti, MD   500 mg at 11/05/16 1000     Discharge Medications: Please see discharge summary for a list of discharge medications.  Relevant Imaging Results:  Relevant Lab Results:   Additional Information SSN: [id number]  Truitt Merle, LCSW

## 2016-11-05 NOTE — Care Management Note (Signed)
Case Management Note  Patient Details  Name: Tanya Morales MRN: [id number] Date of Birth: 1929/08/04  Subjective/Objective:   Left THA                   Action/Plan: Discharge Planning: Chart reviewed. Pt scheduled for dc to [facility] SNF on Monday. CSW following for SNF placement.   PCP Crist Infante MD  Expected Discharge Date:  11/07/2016               Expected Discharge Plan:  [place]  In-House Referral:  Clinical Social Work  Discharge planning Services  CM Consult  Post Acute Care Choice:  NA Choice offered to:  NA  DME Arranged:  N/A DME Agency:  NA  HH Arranged:  NA HH Agency:  NA  Status of Service:  Completed, signed off  If discussed at [place] of Stay Meetings, dates discussed:    Additional Comments:  Erenest Rasher, RN 11/05/2016, 11:14 AM

## 2016-11-06 LAB — CBC
HCT: 30.4 % — ABNORMAL LOW (ref 36.0–46.0)
Hemoglobin: 10.4 g/dL — ABNORMAL LOW (ref 12.0–15.0)
MCH: 32.6 pg (ref 26.0–34.0)
MCHC: 34.2 g/dL (ref 30.0–36.0)
MCV: 95.3 fL (ref 78.0–100.0)
PLATELETS: 208 10*3/uL (ref 150–400)
RBC: 3.19 MIL/uL — ABNORMAL LOW (ref 3.87–5.11)
RDW: 13.4 % (ref 11.5–15.5)
WBC: 9 10*3/uL (ref 4.0–10.5)

## 2016-11-06 NOTE — Progress Notes (Signed)
Physical Therapy Treatment Patient Details Name: Tanya Morales MRN: [id number] DOB: [birth year] Today's Date: 11/06/2016   History of Present Illness L DATHA    PT Comments    Noted spasms of the left leg,  Minimal ROM with exercises. Spoke with son ,Elta Guadeloupe,  who is available to transport the patient to SNF if patient is able to tolerate the activity. Will reevaluate in the AM.  Follow Up Recommendations  SNF     Equipment Recommendations  None recommended by PT    Recommendations for Other Services       Precautions / Restrictions Precautions Precautions: Fall Precaution Comments: don't touch the left ankle    Mobility          Stairs            Wheelchair Mobility    Modified Rankin (Stroke Patients Only)       Balance                                    Cognition Arousal/Alertness: Awake/alert                          Exercises Total Joint Exercises Ankle Circles/Pumps: AROM;Left;15 reps Short Arc QuadSinclair Ship;Left;10 reps Heel Slides: AAROM;Left;10 reps Hip ABduction/ADduction: AAROM;Left;10 reps    General Comments        Pertinent Vitals/Pain Pain Assessment: Faces Pain Score: 7  Faces Pain Scale: Hurts little more Pain Location: left thigh to foot Pain Descriptors / Indicators: Aching;Discomfort;Spasm Pain Intervention(s): Repositioned;Patient requesting pain meds-RN notified;Ice applied    Home Living                      Prior Function            PT Goals (current goals can now be found in the care plan section) Progress towards PT goals: Progressing toward goals    Frequency    7X/week      PT Plan Current plan remains appropriate    Co-evaluation             End of Session   Activity Tolerance: Patient limited by pain Patient left: in bed;with call bell/phone within reach;with bed alarm set;with family/visitor present     Time: [id number] PT Time Calculation (min)  (ACUTE ONLY): 25 min  Charges:  $Gait Training: 23-37 mins $Therapeutic Exercise: 23-37 mins                    G Codes:      [name] 11/06/2016, 5:21 PM

## 2016-11-06 NOTE — Anesthesia Postprocedure Evaluation (Signed)
Anesthesia Post Note  Patient: Tanya Morales  Procedure(s) Performed: Procedure(s) (LRB): LEFT TOTAL HIP ARTHROPLASTY ANTERIOR APPROACH (Left)  Patient location during evaluation: PACU Anesthesia Type: Spinal Level of consciousness: awake Pain management: satisfactory to patient Vital Signs Assessment: post-procedure vital signs reviewed and stable Respiratory status: spontaneous breathing Cardiovascular status: blood pressure returned to baseline Postop Assessment: no headache and spinal receding Anesthetic complications: no       Last Vitals:  Vitals:   11/06/16 0517 11/06/16 1048  BP: (!) 164/60 (!) 120/50  Pulse: 62   Resp: 17   Temp: 36.7 C     Last Pain:  Vitals:   11/06/16 1255  TempSrc:   PainSc: 3                  Nicolette Gieske EDWARD

## 2016-11-06 NOTE — Progress Notes (Signed)
Subjective: Patient stable.  Slightly better day today than yesterday.  She is sitting in a chair   Objective: Vital signs in last 24 hours: Temp:  [98.1 F (36.7 C)-98.9 F (37.2 C)] 98.1 F (36.7 C) (02/18 0517) Pulse Rate:  [62-91] 62 (02/18 0517) Resp:  [17-18] 17 (02/18 0517) BP: (130-180)/(57-70) 164/60 (02/18 0517) SpO2:  [95 %-98 %] 96 % (02/18 0517)  Intake/Output from previous day: 02/17 0701 - 02/18 0700 In: 2310 [P.O.:660; I.V.:1650] Out: 1500 [Urine:1500] Intake/Output this shift: Total I/O In: 240 [P.O.:240] Out: 600 [Urine:600]  Exam:  Dorsiflexion/Plantar flexion intact  Labs:  Recent Labs  11/05/16 0640 11/06/16 0422  HGB 10.6* 10.4*    Recent Labs  11/05/16 0640 11/06/16 0422  WBC 6.5 9.0  RBC 3.41* 3.19*  HCT 31.7* 30.4*  PLT 203 208    Recent Labs  11/05/16 0640  NA 136  K 4.0  CL 103  CO2 31  BUN 5*  CREATININE 0.47  GLUCOSE 133*  CALCIUM 8.2*   No results for input(s): LABPT, INR in the last 72 hours.  Assessment/Plan: Plan for therapy today and transfer to skilled nursing home tomorrow for further rehabilitation   G Alphonzo Severance 11/06/2016, 10:07 AM

## 2016-11-06 NOTE — Progress Notes (Signed)
Physical Therapy Treatment Patient Details Name: Tanya Morales MRN: [id number] DOB: 20-Jun-1929 Today's Date: 11/06/2016    History of Present Illness L DATHA    PT Comments    The patient is tolerating mobility and ambulation much better today. Plans SNF.  Follow Up Recommendations  SNF     Equipment Recommendations  None recommended by PT    Recommendations for Other Services       Precautions / Restrictions Precautions Precautions: Fall    Mobility  Bed Mobility   Bed Mobility: Sit to Supine       Sit to supine: Mod assist   General bed mobility comments: assist the legs and trunk.  Transfers Overall transfer level: Needs assistance Equipment used: Rolling walker (2 wheeled) Transfers: Sit to/from Stand Sit to Stand: Min assist         General transfer comment: cues for hand position, assist to rise, extra time  to stand.  Ambulation/Gait Ambulation/Gait assistance: Min assist Ambulation Distance (Feet): 50 Feet Assistive device: Rolling walker (2 wheeled) Gait Pattern/deviations: Step-to pattern;Step-through pattern     General Gait Details: multimodal cues for sequence and posture.  much improved in ambulation today.   Stairs            Wheelchair Mobility    Modified Rankin (Stroke Patients Only)       Balance                                    Cognition Arousal/Alertness: Awake/alert                          Exercises      General Comments        Pertinent Vitals/Pain Pain Assessment: Faces Faces Pain Scale: Hurts little more Pain Location: left thigh to foot Pain Descriptors / Indicators: Aching;Discomfort Pain Intervention(s): Premedicated before session;Repositioned    Home Living                      Prior Function            PT Goals (current goals can now be found in the care plan section) Progress towards PT goals: Progressing toward goals    Frequency    7X/week      PT Plan Current plan remains appropriate    Co-evaluation             End of Session   Activity Tolerance: Patient tolerated treatment well Patient left: in bed;with call bell/phone within reach     Time: 0930-1002 PT Time Calculation (min) (ACUTE ONLY): 32 min  Charges:  $Gait Training: 23-37 mins                    G Codes:      [name] 11/06/2016, 2:08 PM

## 2016-11-07 DIAGNOSIS — M25552 Pain in left hip: Secondary | ICD-10-CM | POA: Diagnosis not present

## 2016-11-07 DIAGNOSIS — K5909 Other constipation: Secondary | ICD-10-CM | POA: Diagnosis not present

## 2016-11-07 DIAGNOSIS — Z8673 Personal history of transient ischemic attack (TIA), and cerebral infarction without residual deficits: Secondary | ICD-10-CM | POA: Diagnosis not present

## 2016-11-07 DIAGNOSIS — K5900 Constipation, unspecified: Secondary | ICD-10-CM | POA: Diagnosis not present

## 2016-11-07 DIAGNOSIS — Z96642 Presence of left artificial hip joint: Secondary | ICD-10-CM | POA: Diagnosis not present

## 2016-11-07 DIAGNOSIS — R2689 Other abnormalities of gait and mobility: Secondary | ICD-10-CM | POA: Diagnosis not present

## 2016-11-07 DIAGNOSIS — M1612 Unilateral primary osteoarthritis, left hip: Secondary | ICD-10-CM | POA: Diagnosis not present

## 2016-11-07 DIAGNOSIS — R262 Difficulty in walking, not elsewhere classified: Secondary | ICD-10-CM | POA: Diagnosis not present

## 2016-11-07 DIAGNOSIS — M62838 Other muscle spasm: Secondary | ICD-10-CM | POA: Diagnosis not present

## 2016-11-07 DIAGNOSIS — C50919 Malignant neoplasm of unspecified site of unspecified female breast: Secondary | ICD-10-CM | POA: Diagnosis not present

## 2016-11-07 DIAGNOSIS — E785 Hyperlipidemia, unspecified: Secondary | ICD-10-CM | POA: Diagnosis not present

## 2016-11-07 DIAGNOSIS — E039 Hypothyroidism, unspecified: Secondary | ICD-10-CM | POA: Diagnosis not present

## 2016-11-07 DIAGNOSIS — I10 Essential (primary) hypertension: Secondary | ICD-10-CM | POA: Diagnosis not present

## 2016-11-07 DIAGNOSIS — M549 Dorsalgia, unspecified: Secondary | ICD-10-CM | POA: Diagnosis not present

## 2016-11-07 DIAGNOSIS — G8918 Other acute postprocedural pain: Secondary | ICD-10-CM | POA: Diagnosis not present

## 2016-11-07 DIAGNOSIS — Z471 Aftercare following joint replacement surgery: Secondary | ICD-10-CM | POA: Diagnosis not present

## 2016-11-07 MED ORDER — OXYCODONE-ACETAMINOPHEN 5-325 MG PO TABS: 1.0000 | ORAL_TABLET | ORAL | 0 refills | Status: DC | PRN

## 2016-11-07 MED ORDER — METHOCARBAMOL 500 MG PO TABS: 500.0000 mg | ORAL_TABLET | Freq: Four times a day (QID) | ORAL | 0 refills | Status: DC | PRN

## 2016-11-07 NOTE — Op Note (Signed)
NAMEREX, HENSEN             ACCOUNT NO.:  [id number]  MEDICAL RECORD NO.:  [id number]  LOCATION:                                 FACILITY:  PHYSICIAN:  Lind Guest. Ninfa Linden, M.D.DATE OF BIRTH:  1929/04/16  DATE OF PROCEDURE:  11/04/2016 DATE OF DISCHARGE:                              OPERATIVE REPORT   PREOPERATIVE DIAGNOSIS:  Primary osteoarthritis and degenerative joint disease, left hip.  POSTOPERATIVE DIAGNOSIS:  Primary osteoarthritis and degenerative joint disease, left hip.  PROCEDURE:  Left total hip arthroplasty through direct anterior approach.  IMPLANTS:  [employer] G[employer]ion acetabular component size 54 with a single screw, size 36+ 0 neutral polyethylene liner, size 10 Corail femoral component with standard offset, size 36- 2 metal hip ball.  SURGEON:  Lind Guest. Ninfa Linden, M.D.  ASSISTANT:  Erskine Emery, PA-C.  ANESTHESIA:  Spinal.  BLOOD LOSS:  250 mL.  ANTIBIOTICS:  2 g of IV Ancef.  COMPLICATIONS:  None.  INDICATIONS:  Ms. Pytel is a very pleasant 83 year old female who referred from her primary care physician, Dr. Crist Infante, of [place] to evaluate debilitating arthritis involving her left hip.  We saw the arthritis was quite significant and it was detrimentally affected her activities of daily living, her quality of life and her mobility.  Before we set up surgery, we did, however, obtain cardiac clearance because she was having some chest discomfort and she has now been cleared by Cardiology for the surgery.  She and her son understand fully the risks and benefits of the surgery.  They understand the risks of acute blood loss anemia, nerve and vessel injury, fracture, infection, dislocation, DVT.  She understands lot of these risks are height and her age.  However, her pain is quite debilitating, is 10/10 and it is detrimentally affecting her activities of daily living, her quality of life and her mobility.   Her x-ray showed complete loss of the superolateral joint space and medial of her left hip, this periarticular osteophytes and significant sclerotic changes. She understands our goals are decreased pain, improved mobility, and overall improved quality of life.  PROCEDURE DESCRIPTION:  After informed consent was obtained, appropriate left hip was marked.  She was brought to the operating room and spinal anesthesia was obtained while she was on her stretcher.  A Foley catheter was placed.  Then, both feet had traction boots applied to them.  Next, she was placed supine on the Hana fracture table with the perineal post in place and both legs in inline skeletal traction devices, but no traction applied.  Her left operative hip was then prepped and draped with DuraPrep and sterile drapes.  Time-out was called and she was identified as correct patient and correct left hip. We then made an incision just inferior and posterior to the anterior superior iliac spine and carried this obliquely down the leg.  We dissected down the tensor fascia lata muscle and the tensor fascia was then divided longitudinally to proceed with a direct anterior approach to the hip.  We identified and cauterized the circumflex vessels and then identified the hip capsule.  I opened up the hip capsule in L-type format finding a very large  joint effusion and significant periarticular osteophytes.  I placed a Cobra retractor on the medial and lateral femoral neck within the hip capsule and then made our femoral neck cut with an oscillating saw just above the lesser trochanter and completed this with an osteotome.  We placed a corkscrew guide in the femoral head and removed the femoral head in its entirety, found to be devoid of cartilage.  We then placed a bent Hohmann over the medial acetabular rim and began reaming with direct visualization from a size 43 reamer up to a size 52 with all reamers under direct visualization  and the last reamer under direct fluoroscopy, so we could also obtain our depth of reaming, our inclination and anteversion.  We found significant sclerotic and almost marbleized bone.  We then attempted to place our size 52 acetabular component, but it just could not seat.  With that being said, I placed one more reamer with a size 53 reamer under direct visualization and fluoroscopy and then went up to a size 54 acetabular component, which then start very hard.  I still secured this with a single screw.  We then placed a 36+ 0 polyethylene liner for that size acetabular component.  Attention was then turned to the femur.  With the leg externally rotated to 120 degrees, extended and adducted, we were able to use a Hohmann retractor medially and a Hohmann retractor behind the greater trochanter.  We released the lateral joint capsule and used a box cutting osteotome to enter the femoral canal and a rongeur to lateralize.  We then began broaching from a size 8 broach using the Corail broaching system only going up to size 10 because she is a very petite individual.  With the size 10, we trialed a standard offset femoral neck and actually a 36- 2 hip ball, we brought the back over and up with traction and internal rotation reducing the pelvis and we were pleased with leg length, offset, range of motion and stability.  We then dislocated the hip and removed the trial components.  We were able to place the real Corail femoral component with standard offset, size 10 and the real 36- 2 metal hip ball.  We reduced this in the acetabulum and again, we were pleased with stability, leg length and offset, as well as range of motion.  We then irrigated the soft tissue with normal saline solution.  We had a very large joint capsule that we could close with interrupted #1 Ethibond suture followed by running #1 Vicryl in the tensor fascia, 3-0 Vicryl in the deep tissue, 2-0 Vicryl in the subcutaneous  tissue, interrupted 4-0 Monocryl subcuticular stitch and Steri-Strips on the skin.  An Aquacel dressing was applied.  She was taken off the fracture table, taken to the recovery room in stable condition.  All final counts were correct.  There were no complications noted.  Of note, Erskine Emery, PA-C assisted in the entire case.  His assistance was crucial for facilitating all aspects of this case.     Lind Guest. Ninfa Linden, M.D.   ______________________________ Lind Guest. Ninfa Linden, M.D.    CYB/MEDQ  D:  11/04/2016  T:  11/05/2016  Job:  [id number]

## 2016-11-07 NOTE — Progress Notes (Signed)
CSW contacted by nsg to report that pt's son was here to transport pt to [place]. Pt has now declined [facility] transport and feels she can tolerate transport by car. [facility] contacted and transport cancelled. [place] contacted and updated. SNF will provide assistance with transfer from car to wc to facility.  Werner Lean LCSW [phone]

## 2016-11-07 NOTE — Discharge Instructions (Signed)

## 2016-11-07 NOTE — Progress Notes (Signed)
Patient ID: Tanya Morales, female   DOB: 24-Oct-1928, 83 y.o.   MRN: [id number] No acute changes.  Vitals and labs stable.  Patient reports being in pain that is limiting her mobility.  From a medical and ortho standpoint, can be discharged to skilled nursing this afternoon.

## 2016-11-07 NOTE — Clinical Social Work Placement (Signed)
   CLINICAL SOCIAL WORK PLACEMENT  NOTE  Date:  11/07/2016  Patient Details  Name: Tanya Morales MRN: [id number] Date of Birth: 1929/07/29  Clinical Social Work is seeking post-discharge placement for this patient at the [place] level of care (*CSW will initial, date and re-position this form in  chart as items are completed):  Yes   Patient/family provided with [place] Work Department's list of facilities offering this level of care within the geographic area requested by the patient (or if unable, by the patient's family).  Yes   Patient/family informed of their freedom to choose among providers that offer the needed level of care, that participate in Medicare, Medicaid or managed care program needed by the patient, have an available bed and are willing to accept the patient.  Yes   Patient/family informed of [facility]'s ownership interest in [facility] and [facility], as well as of the fact that they are under no obligation to receive care at these facilities.  PASRR submitted to EDS on 11/05/16     PASRR number received on 11/05/16     Existing PASRR number confirmed on       FL2 transmitted to all facilities in geographic area requested by pt/family on 11/05/16     FL2 transmitted to all facilities within larger geographic area on       Patient informed that his/her managed care company has contracts with or will negotiate with certain facilities, including the following:            Patient/family informed of bed offers received.  Patient chooses bed at [facility] recommends and patient chooses bed at      Patient to be transferred to [facility] on 11/07/16.  Patient to be transferred to facility by [facility]     Patient family notified on 11/07/16 of transfer.  Name of family member notified:  SON     PHYSICIAN       Additional Comment: Pt / son are in agreement with d/c to  [place] [facility] [place] today. [facility] transport is required. Medical necessity form completed. Pt is aware out of pocket costs may be associated with [facility] transport. D/C Summary sent to SNF for review. Scripts included in d/c packet. # for report provided to [place].   _______________________________________________ Luretha Rued, LCSW 11/07/2016, 12:06 PM

## 2016-11-07 NOTE — Discharge Summary (Signed)
Patient ID: Tanya Morales MRN: [id number] DOB/AGE: 12/17/1928 83 y.o.  Admit date: 11/04/2016 Discharge date: 11/07/2016  Admission Diagnoses:  Principal Problem:   Osteoarthritis of left hip Active Problems:   Status post left hip replacement   Discharge Diagnoses:  Same  Past Medical History:  Diagnosis Date  . Arthritis    OA  . Breast cancer, stage 1 ([place]) 03/14/2011   left BREAST, AND 4 WEEKS RADIATION  . Chronic back pain    LOWER  . Heart murmur   . Hyperlipidemia   . Hypertension   . Hypothyroidism   . Raynaud disease   . Spinal headache 61 YRS AGO AFTER CHILDBIRTH  . TIA (transient ischemic attack) march 2012    Surgeries: Procedure(s): LEFT TOTAL HIP ARTHROPLASTY ANTERIOR APPROACH on 11/04/2016   Consultants:   Discharged Condition: Improved  Hospital Course: Tanya Morales is an 83 y.o. female who was admitted 11/04/2016 for operative treatment ofOsteoarthritis of left hip. Patient has severe unremitting pain that affects sleep, daily activities, and work/hobbies. After pre-op clearance the patient was taken to the operating room on 11/04/2016 and underwent  Procedure(s): LEFT TOTAL HIP ARTHROPLASTY ANTERIOR APPROACH.    Patient was given perioperative antibiotics: Anti-infectives    Start     Dose/Rate Route Frequency Ordered Stop   11/04/16 2000  ceFAZolin (ANCEF) IVPB 1 g/50 mL premix     1 g 100 mL/hr over 30 Minutes Intravenous Every 6 hours 11/04/16 1741 11/05/16 0320   11/04/16 1230  ceFAZolin (ANCEF) IVPB 2g/100 mL premix     2 g 200 mL/hr over 30 Minutes Intravenous On call to O.R. 11/04/16 1221 11/04/16 1400       Patient was given sequential compression devices, early ambulation, and chemoprophylaxis to prevent DVT.  Patient benefited maximally from hospital stay and there were no complications.    Recent vital signs: Patient Vitals for the past 24 hrs:  BP Temp Temp src Pulse Resp SpO2  11/07/16 0456 (!) 161/55 98.9 F (37.2 C)  Oral 88 16 99 %  11/06/16 2242 (!) 133/57 98.4 F (36.9 C) Oral 85 16 99 %  11/06/16 1403 (!) 142/54 99.2 F (37.3 C) Oral 85 16 99 %  11/06/16 1048 (!) 120/50 - - - - -     Recent laboratory studies:  Recent Labs  11/05/16 0640 11/06/16 0422  WBC 6.5 9.0  HGB 10.6* 10.4*  HCT 31.7* 30.4*  PLT 203 208  NA 136  --   K 4.0  --   CL 103  --   CO2 31  --   BUN 5*  --   CREATININE 0.47  --   GLUCOSE 133*  --   CALCIUM 8.2*  --      Discharge Medications:   Allergies as of 11/07/2016      Reactions   Celebrex [celecoxib]    COULD NOT BREATHE   Epinephrine    COULD NOT STAND, FELT BAD      Medication List    STOP taking these medications   traMADol 50 MG tablet Commonly known as:  ULTRAM     TAKE these medications   amLODipine 5 MG tablet Commonly known as:  NORVASC Take 5 mg by mouth every morning.   aspirin 325 MG tablet Take 325 mg by mouth daily.   CAL-MAG 500-250 MG Tabs Generic drug:  Calcium-Magnesium Take 1 tablet by mouth daily.   cholecalciferol 400 units Tabs tablet Commonly known as:  VITAMIN D Take  400 Units by mouth daily.   co-enzyme Q-10 30 MG capsule Take 30 mg by mouth daily.   denosumab 60 MG/ML Soln injection Commonly known as:  PROLIA Inject 60 mg into the skin every 6 (six) months. Administer in upper arm, thigh, or abdomen   levothyroxine 50 MCG tablet Commonly known as:  SYNTHROID, LEVOTHROID Take 50 mcg by mouth daily before breakfast.   methocarbamol 500 MG tablet Commonly known as:  ROBAXIN Take 1 tablet (500 mg total) by mouth every 6 (six) hours as needed for muscle spasms.   multivitamin with minerals Tabs tablet Take 1 tablet by mouth daily.   nystatin 100000 UNIT/ML suspension Commonly known as:  MYCOSTATIN Take 5 mLs by mouth 4 (four) times daily.   olmesartan 20 MG tablet Commonly known as:  BENICAR Take 20 mg by mouth every morning.   oxyCODONE-acetaminophen 5-325 MG tablet Commonly known as:   ROXICET Take 1-2 tablets by mouth every 4 (four) hours as needed.   PROBIOTIC PO Take 1 capsule by mouth daily.   simvastatin 10 MG tablet Commonly known as:  ZOCOR Take 10 mg by mouth at bedtime.   vitamin C 500 MG tablet Commonly known as:  ASCORBIC ACID Take 500 mg by mouth daily.   ZOFRAN 4 MG tablet Generic drug:  ondansetron Take 4 mg by mouth every 6 (six) hours as needed for nausea or vomiting.            Durable Medical Equipment        Start     Ordered   11/04/16 1742  DME Walker rolling  Once    Question:  Patient needs a walker to treat with the following condition  Answer:  Status post left hip replacement   11/04/16 1741   11/04/16 1742  DME 3 n 1  Once     11/04/16 1741      Diagnostic Studies: Dg Pelvis Portable  Result Date: 11/04/2016 CLINICAL DATA:  Status post left hip replacement. Initial encounter. EXAM: PORTABLE PELVIS 1-2 VIEWS COMPARISON:  Intraoperative radiographs performed earlier today at 3:03 p.m. FINDINGS: The patient is status post left hip arthroplasty. There is no evidence of loosening or new fracture. Overlying postoperative soft tissue air is seen. The right hip is unremarkable in appearance. The sacroiliac joints are grossly unremarkable. Mild vascular calcifications are noted. IMPRESSION: Status post left hip arthroplasty. No evidence of loosening or new fracture. Electronically Signed   By: Garald Balding M.D.   On: 11/04/2016 16:09   Dg C-arm 1-60 Min-no Report  Result Date: 11/04/2016 Fluoroscopy was utilized by the requesting physician.  No radiographic interpretation.   Dg Hip Operative Unilat W Or W/o Pelvis Left  Result Date: 11/04/2016 CLINICAL DATA:  Left total hip lungs EXAM: OPERATIVE left HIP (WITH PELVIS IF PERFORMED) 2 VIEWS TECHNIQUE: Fluoroscopic spot image(s) were submitted for interpretation post-operatively. COMPARISON:  Left hip radiograph 04/27/2016 FINDINGS: There are 2 fluoroscopic images demonstrating  placement of a left total hip arthroplasty. Femoral and acetabular components are well seated. Fluoroscopy time was reported as 30 seconds. IMPRESSION: Left total hip arthroplasty intraoperative images. Electronically Signed   By: Ulyses Jarred M.D.   On: 11/04/2016 15:27    Disposition: to skilled nursing facility  Discharge Instructions    Discharge patient    Complete by:  As directed    Discharge disposition:  03-Skilled [facility]   Discharge patient date:  11/07/2016      Follow-up Information  Mcarthur Rossetti, MD Follow up in 2 week(s).   Specialty:  Orthopedic Surgery Contact information: [place] [address] [phone]            Signed: Mcarthur Rossetti 11/07/2016, 7:21 AM

## 2016-11-07 NOTE — Progress Notes (Signed)
  Physical Therapy Treatment Patient Details Name: Tanya Morales MRN: [id number] DOB: 1929/08/09 Today's Date: 11/07/2016    History of Present Illness L DATHA    PT Comments    The patient is moving better this visit, not as much spasm. Plans Dc to day to SNF.   Follow Up Recommendations  SNF     Equipment Recommendations  None recommended by PT    Recommendations for Other Services       Precautions / Restrictions Precautions Precautions: Fall    Mobility  Bed Mobility   Bed Mobility: Sit to Supine       Sit to supine: Mod assist   General bed mobility comments: assist the legs and trunk.  Transfers Overall transfer level: Needs assistance Equipment used: Rolling walker (2 wheeled) Transfers: Sit to/from Stand Sit to Stand: Mod assist Stand pivot transfers: Mod assist       General transfer comment: cues for hand position, assist to rise, extra time  to stand. 4' with RW to walk to the bed.  Ambulation/Gait                 Stairs            Wheelchair Mobility    Modified Rankin (Stroke Patients Only)       Balance                                    Cognition Arousal/Alertness: Awake/alert Behavior During Therapy: Anxious                        Exercises      General Comments        Pertinent Vitals/Pain Pain Score: 7  Pain Location: left thigh to foot Pain Descriptors / Indicators: Aching;Discomfort;Spasm Pain Intervention(s): Premedicated before session;Repositioned    Home Living                      Prior Function            PT Goals (current goals can now be found in the care plan section) Progress towards PT goals: Progressing toward goals    Frequency    7X/week      PT Plan Current plan remains appropriate    Co-evaluation             End of Session   Activity Tolerance: Patient tolerated treatment well Patient left: in bed;with call bell/phone  within reach Nurse Communication: Mobility status PT Visit Diagnosis: Unsteadiness on feet (R26.81);Muscle weakness (generalized) (M62.81);Difficulty in walking, not elsewhere classified (R26.2)     Time: [id number] PT Time Calculation (min) (ACUTE ONLY): 10 min  Charges:  $Gait Training: 8-22 mins                    G Codes:       [name] 11/07/2016, 2:16 PM [name] PT [phone]

## 2016-11-07 NOTE — Progress Notes (Signed)
Pt d/c'd to [facility]. Packet in hand. Transported to facility via car with son.

## 2016-11-08 DIAGNOSIS — M62838 Other muscle spasm: Secondary | ICD-10-CM | POA: Diagnosis not present

## 2016-11-08 DIAGNOSIS — R2689 Other abnormalities of gait and mobility: Secondary | ICD-10-CM | POA: Diagnosis not present

## 2016-11-08 DIAGNOSIS — K5909 Other constipation: Secondary | ICD-10-CM | POA: Diagnosis not present

## 2016-11-08 DIAGNOSIS — G8918 Other acute postprocedural pain: Secondary | ICD-10-CM | POA: Diagnosis not present

## 2016-11-08 DIAGNOSIS — M25552 Pain in left hip: Secondary | ICD-10-CM | POA: Diagnosis not present

## 2016-11-09 DIAGNOSIS — M1612 Unilateral primary osteoarthritis, left hip: Secondary | ICD-10-CM | POA: Diagnosis not present

## 2016-11-09 DIAGNOSIS — I10 Essential (primary) hypertension: Secondary | ICD-10-CM | POA: Diagnosis not present

## 2016-11-09 DIAGNOSIS — Z471 Aftercare following joint replacement surgery: Secondary | ICD-10-CM | POA: Diagnosis not present

## 2016-11-09 DIAGNOSIS — R262 Difficulty in walking, not elsewhere classified: Secondary | ICD-10-CM | POA: Diagnosis not present

## 2016-11-09 DIAGNOSIS — K5900 Constipation, unspecified: Secondary | ICD-10-CM | POA: Diagnosis not present

## 2016-11-17 ENCOUNTER — Ambulatory Visit (INDEPENDENT_AMBULATORY_CARE_PROVIDER_SITE_OTHER): Payer: Medicare Other | Admitting: Orthopaedic Surgery

## 2016-11-17 DIAGNOSIS — Z96642 Presence of left artificial hip joint: Secondary | ICD-10-CM

## 2016-11-17 MED ORDER — OXYCODONE-ACETAMINOPHEN 5-325 MG PO TABS: 1.0000 | ORAL_TABLET | Freq: Four times a day (QID) | ORAL | 0 refills | Status: DC | PRN

## 2016-11-17 MED ORDER — METHOCARBAMOL 500 MG PO TABS: 500.0000 mg | ORAL_TABLET | Freq: Four times a day (QID) | ORAL | 0 refills | Status: DC | PRN

## 2016-11-17 NOTE — Progress Notes (Signed)
The patient is 2 weeks status post a left total hip replacement. She is transition from skilled nursing to starting home health therapy tomorrow. She does need a refill of her Robaxin and her Percocet.  On exam she is ambulating with a walker. Her leg lengths feel equal. She has a moderate seroma which I drained fluid from her left hip. Her incision looks great. I placed knee Steri-Strips. The graft all questions encouraged and answered. I'll see her back in a month to see how she is doing overall. If she has any issues she'll let us know.

## 2016-11-18 ENCOUNTER — Telehealth (INDEPENDENT_AMBULATORY_CARE_PROVIDER_SITE_OTHER): Payer: Self-pay

## 2016-11-18 DIAGNOSIS — I7300 Raynaud's syndrome without gangrene: Secondary | ICD-10-CM | POA: Diagnosis not present

## 2016-11-18 DIAGNOSIS — I110 Hypertensive heart disease with heart failure: Secondary | ICD-10-CM | POA: Diagnosis not present

## 2016-11-18 DIAGNOSIS — Z471 Aftercare following joint replacement surgery: Secondary | ICD-10-CM | POA: Diagnosis not present

## 2016-11-18 DIAGNOSIS — M549 Dorsalgia, unspecified: Secondary | ICD-10-CM | POA: Diagnosis not present

## 2016-11-18 DIAGNOSIS — I509 Heart failure, unspecified: Secondary | ICD-10-CM | POA: Diagnosis not present

## 2016-11-18 DIAGNOSIS — M810 Age-related osteoporosis without current pathological fracture: Secondary | ICD-10-CM | POA: Diagnosis not present

## 2016-11-18 NOTE — Telephone Encounter (Signed)
Patient states around 9-10:00 lastnight she started to have HORRIBLE pain, she is very tearful on the phone. She states she didn't sleep harldy at all due to the pain lastnight. Can she take more often?

## 2016-11-18 NOTE — Telephone Encounter (Signed)
She can take her pain meds every 4 hours and can take 2 at a time.  Also, have her take 3 advil at once twice a day.

## 2016-11-18 NOTE — Telephone Encounter (Signed)
Patient aware of the below message  

## 2016-11-21 DIAGNOSIS — I110 Hypertensive heart disease with heart failure: Secondary | ICD-10-CM | POA: Diagnosis not present

## 2016-11-21 DIAGNOSIS — I509 Heart failure, unspecified: Secondary | ICD-10-CM | POA: Diagnosis not present

## 2016-11-21 DIAGNOSIS — Z471 Aftercare following joint replacement surgery: Secondary | ICD-10-CM | POA: Diagnosis not present

## 2016-11-21 DIAGNOSIS — M810 Age-related osteoporosis without current pathological fracture: Secondary | ICD-10-CM | POA: Diagnosis not present

## 2016-11-21 DIAGNOSIS — I7300 Raynaud's syndrome without gangrene: Secondary | ICD-10-CM | POA: Diagnosis not present

## 2016-11-21 DIAGNOSIS — M549 Dorsalgia, unspecified: Secondary | ICD-10-CM | POA: Diagnosis not present

## 2016-11-22 ENCOUNTER — Ambulatory Visit (HOSPITAL_COMMUNITY): Payer: Medicare Other

## 2016-11-22 ENCOUNTER — Other Ambulatory Visit (INDEPENDENT_AMBULATORY_CARE_PROVIDER_SITE_OTHER): Payer: Self-pay

## 2016-11-22 ENCOUNTER — Telehealth (INDEPENDENT_AMBULATORY_CARE_PROVIDER_SITE_OTHER): Payer: Self-pay | Admitting: Orthopaedic Surgery

## 2016-11-22 DIAGNOSIS — M79605 Pain in left leg: Secondary | ICD-10-CM

## 2016-11-22 DIAGNOSIS — M7989 Other specified soft tissue disorders: Secondary | ICD-10-CM

## 2016-11-22 NOTE — Telephone Encounter (Signed)
Pt stated that her thigh is swollen and she is having bad pain in her knee and left foot as well. Pt had an injection for drainage.  [phone]

## 2016-11-22 NOTE — Telephone Encounter (Signed)
Please advise 

## 2016-11-23 ENCOUNTER — Telehealth (INDEPENDENT_AMBULATORY_CARE_PROVIDER_SITE_OTHER): Payer: Self-pay | Admitting: Orthopaedic Surgery

## 2016-11-23 DIAGNOSIS — I110 Hypertensive heart disease with heart failure: Secondary | ICD-10-CM | POA: Diagnosis not present

## 2016-11-23 DIAGNOSIS — I7300 Raynaud's syndrome without gangrene: Secondary | ICD-10-CM | POA: Diagnosis not present

## 2016-11-23 DIAGNOSIS — I509 Heart failure, unspecified: Secondary | ICD-10-CM | POA: Diagnosis not present

## 2016-11-23 DIAGNOSIS — M549 Dorsalgia, unspecified: Secondary | ICD-10-CM | POA: Diagnosis not present

## 2016-11-23 DIAGNOSIS — M810 Age-related osteoporosis without current pathological fracture: Secondary | ICD-10-CM | POA: Diagnosis not present

## 2016-11-23 DIAGNOSIS — Z471 Aftercare following joint replacement surgery: Secondary | ICD-10-CM | POA: Diagnosis not present

## 2016-11-23 NOTE — Telephone Encounter (Signed)
Verbal order given  

## 2016-11-23 NOTE — Telephone Encounter (Signed)
Costella Hatcher (OT) with [facility] called needing verbal orders for (OT) 2 wk 3 and 1 wk 1. The number to contact Clair Gulling is [phone]

## 2016-11-24 ENCOUNTER — Ambulatory Visit (INDEPENDENT_AMBULATORY_CARE_PROVIDER_SITE_OTHER): Payer: Medicare Other | Admitting: Orthopaedic Surgery

## 2016-11-24 DIAGNOSIS — Z96642 Presence of left artificial hip joint: Secondary | ICD-10-CM

## 2016-11-24 NOTE — Progress Notes (Signed)
Is been only one week since [place] almost Milus Banister last. She is 20 days post a left hip replacement. She's been having some problems with a accumulation of a postoperative seroma. She comes in today with pain and most of the seroma drained off.  Examination incision itself looks good. I removed the old Steri-Strips and can leave him off this point. He does infection all. I was a clean it with better alcohol and withdrew 60 mL of seroma fluid from her hip. This is a lot for her she is very petite and thin individual.  I encouraged her to keep up with her activities and with walking. I like see her back in just one week to see how she is doing overall. If she still having severe pain with an AP and lateral of her left hip

## 2016-11-28 ENCOUNTER — Telehealth (INDEPENDENT_AMBULATORY_CARE_PROVIDER_SITE_OTHER): Payer: Self-pay | Admitting: Orthopaedic Surgery

## 2016-11-28 NOTE — Telephone Encounter (Signed)
Can you advise? 

## 2016-11-28 NOTE — Telephone Encounter (Signed)
Patient called asking for a refill generic percocet. CB # [phone]

## 2016-11-29 ENCOUNTER — Telehealth (INDEPENDENT_AMBULATORY_CARE_PROVIDER_SITE_OTHER): Payer: Self-pay | Admitting: Radiology

## 2016-11-29 MED ORDER — OXYCODONE-ACETAMINOPHEN 5-325 MG PO TABS: 1.0000 | ORAL_TABLET | Freq: Four times a day (QID) | ORAL | 0 refills | Status: DC | PRN

## 2016-11-29 NOTE — Telephone Encounter (Signed)
Please advise 

## 2016-11-29 NOTE — Telephone Encounter (Signed)
Patient call and left message on Triage VM that she needs a refill on her pain meds.  Call back # [phone]

## 2016-11-29 NOTE — Telephone Encounter (Signed)
Patient aware that her medication is ready

## 2016-11-29 NOTE — Telephone Encounter (Signed)
Can come and pick up script 

## 2016-12-01 ENCOUNTER — Ambulatory Visit (INDEPENDENT_AMBULATORY_CARE_PROVIDER_SITE_OTHER): Payer: Medicare Other | Admitting: Physician Assistant

## 2016-12-01 ENCOUNTER — Ambulatory Visit (INDEPENDENT_AMBULATORY_CARE_PROVIDER_SITE_OTHER): Payer: Medicare Other

## 2016-12-01 ENCOUNTER — Encounter (INDEPENDENT_AMBULATORY_CARE_PROVIDER_SITE_OTHER): Payer: Self-pay | Admitting: Physician Assistant

## 2016-12-01 DIAGNOSIS — M1612 Unilateral primary osteoarthritis, left hip: Secondary | ICD-10-CM | POA: Diagnosis not present

## 2016-12-01 DIAGNOSIS — Z96642 Presence of left artificial hip joint: Secondary | ICD-10-CM

## 2016-12-01 DIAGNOSIS — M25562 Pain in left knee: Secondary | ICD-10-CM

## 2016-12-01 DIAGNOSIS — M25561 Pain in right knee: Secondary | ICD-10-CM

## 2016-12-01 MED ORDER — LIDOCAINE HCL 1 % IJ SOLN
3.0000 mL | INTRAMUSCULAR | Status: AC | PRN
  Administered 2016-12-01: 3 mL

## 2016-12-01 MED ORDER — METHYLPREDNISOLONE ACETATE 40 MG/ML IJ SUSP
40.0000 mg | INTRAMUSCULAR | Status: AC | PRN
  Administered 2016-12-01: 40 mg via INTRA_ARTICULAR

## 2016-12-01 MED ORDER — METHOCARBAMOL 500 MG PO TABS: 500.0000 mg | ORAL_TABLET | Freq: Four times a day (QID) | ORAL | 1 refills | Status: DC | PRN

## 2016-12-01 NOTE — Progress Notes (Signed)
Office Visit Note   Patient: Tanya Morales           Date of Birth: 14-Jan-1929           MRN: [id number] Visit Date: 12/01/2016              Requested by: Crist Infante, MD [address] [place], [place] [address] PCP: Jerlyn Ly, MD   Assessment & Plan: Visit Diagnoses:  1. Primary osteoarthritis of left hip   2. Status post total replacement of left hip   3. Acute pain of left knee     Plan: She'll follow up with [place] in 2 weeks to check and see how much of her pain has resolved status post the left knee injection. Continue to work on gait balance and range of motion and strengthening of left hip.  Follow-Up Instructions: Return in about 2 weeks (around 12/15/2016).   Orders:  Orders Placed This Encounter  Procedures  . Large Joint Injection/Arthrocentesis  . XR HIP UNILAT W OR W/O PELVIS 2-3 VIEWS LEFT  . XR KNEE 3 VIEW LEFT   Meds ordered this encounter  Medications  . methocarbamol (ROBAXIN) 500 MG tablet    Sig: Take 1 tablet (500 mg total) by mouth every 6 (six) hours as needed for muscle spasms.    Dispense:  60 tablet    Refill:  1      Procedures: Large Joint Inj Date/Time: 12/01/2016 4:45 PM Performed by: Pete Pelt Authorized by: Pete Pelt   Consent Given by:  Patient Indications:  Pain Location:  Knee Site:  L knee Needle Size:  22 G Approach:  Anterolateral Ultrasound Guidance: No   Fluoroscopic Guidance: No   Medications:  40 mg methylPREDNISolone acetate 40 MG/ML; 3 mL lidocaine 1 % Aspiration Attempted: No   Patient tolerance:  Patient tolerated the procedure well with no immediate complications     Clinical Data: No additional findings.   Subjective: Chief Complaint  Patient presents with  . Left Hip - Routine Post Op, Pain, Follow-up    HPI Miss Tanya Morales is now 4 weeks status post left total hip arthroplasty. She's having increased pain on her left leg particularly the knee. No pain with range of motion of the  knee. She continues to ambulate on a walker. She is requesting a refill on her Robaxin. Review of Systems   Objective: Vital Signs: There were no vitals taken for this visit.  Physical Exam  Constitutional: [name], place, and time. She appears well-developed and well-nourished. No distress.  Neurological: She is alert and oriented to person, place, and time.  Psychiatric: She has a normal mood and affect. Her behavior is normal.    Ortho Exam Left hip surgical incisions healing well no signs of infection no seroma. Right knee she has pain with range of motion of the knee tenderness along medial lateral joint lines. No instability valgus varus stressing no effusion no abnormal warmth no erythema. Left calf supple nontender. She does have slight edema of the left lower leg compared to the right leg.  Specialty Comments:  No specialty comments available.  Imaging: Xr Hip Unilat W Or W/o Pelvis 2-3 Views Left  Result Date: 12/01/2016 AP pelvis and a lateral view of the left hip: No acute fractures. Status post left total hip arthroplasty with well-seated components. No complications.  Xr Knee 3 View Left  Result Date: 12/01/2016 Left knee 3 views: No acute fracture. Moderate medial joint  line narrowing. Mild patellofemoral changes.. No evidence of dislocation or bony abnormalities otherwise    PMFS History: Patient Active Problem List   Diagnosis Date Noted  . Status post left hip replacement 11/04/2016  . Abnormal nuclear stress test   . Arthritis of midfoot 05/24/2016  . Osteoarthritis of left hip 04/21/2016  . Neck pain 11/04/2014  . Midline low back pain without sciatica 11/04/2014  . Breast cancer, stage 1 ([place]) 03/14/2011  . Postop check 03/14/2011   Past Medical History:  Diagnosis Date  . Arthritis    OA  . Breast cancer, stage 1 ([place]) 03/14/2011   left BREAST, AND 4 WEEKS RADIATION  . Chronic back pain    LOWER  . Heart murmur   .  Hyperlipidemia   . Hypertension   . Hypothyroidism   . Raynaud disease   . Spinal headache 61 YRS AGO AFTER CHILDBIRTH  . TIA (transient ischemic attack) march 2012    Family History  Problem Relation Age of Onset  . Congestive Heart Failure Mother   . Cancer Maternal Grandmother   . Rheum arthritis Sister   . Healthy Sister   . Other Son     CHRONIC INFLAMMATORY DEMYELINATING POLYNEUROPATHY  . Healthy Son     Past Surgical History:  Procedure Laterality Date  . BREAST SURGERY Left 02/23/11   left lumpectomy  . CARDIAC CATHETERIZATION N/A 10/12/2016   Procedure: Left Heart Cath and Coronary Angiography;  Surgeon: Jettie Booze, MD;  Location: [facility];  Service: Cardiovascular;  Laterality: N/A;  . COLONSCOPY  2009  . EYE SURGERY Bilateral 2013   IOC LENS FOR CATARACTS  . FINGER SURGERY    . TONSILLECTOMY AND ADENOIDECTOMY    . TOTAL ABDOMINAL HYSTERECTOMY  1974   COMPLETE  . TOTAL HIP ARTHROPLASTY Left 11/04/2016   Procedure: LEFT TOTAL HIP ARTHROPLASTY ANTERIOR APPROACH;  Surgeon: Mcarthur Rossetti, MD;  Location: [facility];  Service: Orthopedics;  Laterality: Left;   Social History   Occupational History  . Not on file.   Social History Main Topics  . Smoking status: Former Smoker    Types: Cigarettes    Quit date: 09/20/1967  . Smokeless tobacco: Never Used     Comment: quit 1969  . Alcohol use No  . Drug use: No  . Sexual activity: Not on file

## 2016-12-02 DIAGNOSIS — L661 Lichen planopilaris: Secondary | ICD-10-CM | POA: Diagnosis not present

## 2016-12-08 DIAGNOSIS — Z471 Aftercare following joint replacement surgery: Secondary | ICD-10-CM | POA: Diagnosis not present

## 2016-12-08 DIAGNOSIS — M810 Age-related osteoporosis without current pathological fracture: Secondary | ICD-10-CM | POA: Diagnosis not present

## 2016-12-08 DIAGNOSIS — M549 Dorsalgia, unspecified: Secondary | ICD-10-CM | POA: Diagnosis not present

## 2016-12-08 DIAGNOSIS — I509 Heart failure, unspecified: Secondary | ICD-10-CM | POA: Diagnosis not present

## 2016-12-08 DIAGNOSIS — I7300 Raynaud's syndrome without gangrene: Secondary | ICD-10-CM | POA: Diagnosis not present

## 2016-12-08 DIAGNOSIS — I110 Hypertensive heart disease with heart failure: Secondary | ICD-10-CM | POA: Diagnosis not present

## 2016-12-09 ENCOUNTER — Telehealth (INDEPENDENT_AMBULATORY_CARE_PROVIDER_SITE_OTHER): Payer: Self-pay | Admitting: Orthopaedic Surgery

## 2016-12-09 DIAGNOSIS — M810 Age-related osteoporosis without current pathological fracture: Secondary | ICD-10-CM | POA: Diagnosis not present

## 2016-12-09 DIAGNOSIS — Z471 Aftercare following joint replacement surgery: Secondary | ICD-10-CM | POA: Diagnosis not present

## 2016-12-09 DIAGNOSIS — M549 Dorsalgia, unspecified: Secondary | ICD-10-CM | POA: Diagnosis not present

## 2016-12-09 DIAGNOSIS — I509 Heart failure, unspecified: Secondary | ICD-10-CM | POA: Diagnosis not present

## 2016-12-09 DIAGNOSIS — I7300 Raynaud's syndrome without gangrene: Secondary | ICD-10-CM | POA: Diagnosis not present

## 2016-12-09 DIAGNOSIS — I110 Hypertensive heart disease with heart failure: Secondary | ICD-10-CM | POA: Diagnosis not present

## 2016-12-09 NOTE — Telephone Encounter (Signed)
Pt requested a call back regarding her Percocet, she wants to come off of it.  [phone]

## 2016-12-12 ENCOUNTER — Telehealth (INDEPENDENT_AMBULATORY_CARE_PROVIDER_SITE_OTHER): Payer: Self-pay | Admitting: Orthopaedic Surgery

## 2016-12-12 ENCOUNTER — Other Ambulatory Visit (INDEPENDENT_AMBULATORY_CARE_PROVIDER_SITE_OTHER): Payer: Self-pay | Admitting: Orthopaedic Surgery

## 2016-12-12 ENCOUNTER — Other Ambulatory Visit (INDEPENDENT_AMBULATORY_CARE_PROVIDER_SITE_OTHER): Payer: Self-pay

## 2016-12-12 MED ORDER — HYDROCODONE-ACETAMINOPHEN 5-325 MG PO TABS: 1.0000 | ORAL_TABLET | Freq: Four times a day (QID) | ORAL | 0 refills | Status: DC | PRN

## 2016-12-12 MED ORDER — TIZANIDINE HCL 4 MG PO TABS: ORAL_TABLET | ORAL | 0 refills | Status: DC

## 2016-12-12 NOTE — Telephone Encounter (Signed)
Patient aware Rx ready for pick up.  

## 2016-12-12 NOTE — Telephone Encounter (Signed)
Please advise, she would like something less than Percocet

## 2016-12-12 NOTE — Telephone Encounter (Signed)
Patient called asking for someone to call her regarding her pain medication. She ran out of Percocets yesterday and she is still in a lot of pain from her surgery back in Feb. CB # [phone]

## 2016-12-12 NOTE — Telephone Encounter (Signed)
She can come to the [place] office this afternoon to pick up a prescription

## 2016-12-12 NOTE — Progress Notes (Unsigned)
zanaflex

## 2016-12-13 DIAGNOSIS — M810 Age-related osteoporosis without current pathological fracture: Secondary | ICD-10-CM | POA: Diagnosis not present

## 2016-12-13 DIAGNOSIS — M549 Dorsalgia, unspecified: Secondary | ICD-10-CM | POA: Diagnosis not present

## 2016-12-13 DIAGNOSIS — I110 Hypertensive heart disease with heart failure: Secondary | ICD-10-CM | POA: Diagnosis not present

## 2016-12-13 DIAGNOSIS — I7300 Raynaud's syndrome without gangrene: Secondary | ICD-10-CM | POA: Diagnosis not present

## 2016-12-13 DIAGNOSIS — I509 Heart failure, unspecified: Secondary | ICD-10-CM | POA: Diagnosis not present

## 2016-12-13 DIAGNOSIS — Z471 Aftercare following joint replacement surgery: Secondary | ICD-10-CM | POA: Diagnosis not present

## 2016-12-14 DIAGNOSIS — I509 Heart failure, unspecified: Secondary | ICD-10-CM | POA: Diagnosis not present

## 2016-12-14 DIAGNOSIS — M549 Dorsalgia, unspecified: Secondary | ICD-10-CM | POA: Diagnosis not present

## 2016-12-14 DIAGNOSIS — I7300 Raynaud's syndrome without gangrene: Secondary | ICD-10-CM | POA: Diagnosis not present

## 2016-12-14 DIAGNOSIS — M810 Age-related osteoporosis without current pathological fracture: Secondary | ICD-10-CM | POA: Diagnosis not present

## 2016-12-14 DIAGNOSIS — I110 Hypertensive heart disease with heart failure: Secondary | ICD-10-CM | POA: Diagnosis not present

## 2016-12-14 DIAGNOSIS — Z471 Aftercare following joint replacement surgery: Secondary | ICD-10-CM | POA: Diagnosis not present

## 2016-12-15 ENCOUNTER — Ambulatory Visit (INDEPENDENT_AMBULATORY_CARE_PROVIDER_SITE_OTHER): Payer: Self-pay | Admitting: Physician Assistant

## 2016-12-15 ENCOUNTER — Ambulatory Visit (INDEPENDENT_AMBULATORY_CARE_PROVIDER_SITE_OTHER): Payer: Medicare Other | Admitting: Physician Assistant

## 2016-12-15 ENCOUNTER — Encounter (INDEPENDENT_AMBULATORY_CARE_PROVIDER_SITE_OTHER): Payer: Self-pay | Admitting: Physician Assistant

## 2016-12-15 DIAGNOSIS — Z96642 Presence of left artificial hip joint: Secondary | ICD-10-CM

## 2016-12-15 DIAGNOSIS — L821 Other seborrheic keratosis: Secondary | ICD-10-CM | POA: Diagnosis not present

## 2016-12-15 DIAGNOSIS — L820 Inflamed seborrheic keratosis: Secondary | ICD-10-CM | POA: Diagnosis not present

## 2016-12-15 DIAGNOSIS — L814 Other melanin hyperpigmentation: Secondary | ICD-10-CM | POA: Diagnosis not present

## 2016-12-15 DIAGNOSIS — D1801 Hemangioma of skin and subcutaneous tissue: Secondary | ICD-10-CM | POA: Diagnosis not present

## 2016-12-15 NOTE — Progress Notes (Signed)
Office Visit Note   Patient: Tanya Morales           Date of Birth: [birth year]           MRN: [id number] Visit Date: 12/15/2016              Requested by: Tanya Infante, MD [address] [place], [place] [address] PCP: Tanya Ly, MD   Assessment & Plan: Visit Diagnoses:  1. Status post total replacement of left hip     Plan: Continue to work on range of motion strengthening the hip. Discussed with her applying heat to the hip to help with reabsorption of the hematoma.  Follow-Up Instructions: Return in about 4 weeks (around 01/12/2017).   Orders:  No orders of the defined types were placed in this encounter.  No orders of the defined types were placed in this encounter.     Procedures: No procedures performed   Clinical Data: No additional findings.   Subjective: Chief Complaint  Patient presents with  . Left Hip - Routine Post Op  . Left Knee - Follow-up    HPI Tanya Morales turns today almost 7 weeks status post left total hip arthroplasty. She reports forces seizures have recurrent swelling left hip. She still Emily 1 walker. She feels like she is progressing slowly. It she states her knees somewhat better does not feel however that the injection helped much.   Review of Systems   Objective: Vital Signs: There were no vitals taken for this visit.  Physical Exam Alert and oriented 3. Ortho Exam Left hip she has overall good range of motion of the hip. She is able to rise from a sitting position to standing easily on her own. She doesn't walk with a use a walker. Left hip surgical incisions healing well no signs of infection. She has some definite swelling over the lateral aspect the hip consistent with seroma or hematoma. Attempted aspiration performed today no aspirate was obtained Specialty Comments:  No specialty comments available.  Imaging: No results found.   PMFS History: Patient Active Problem List   Diagnosis Date Noted  . Status post  left hip replacement 11/04/2016  . Abnormal nuclear stress test   . Arthritis of midfoot 05/24/2016  . Osteoarthritis of left hip 04/21/2016  . Neck pain 11/04/2014  . Midline low back pain without sciatica 11/04/2014  . Breast cancer, stage 1 ([place]) 03/14/2011  . Postop check 03/14/2011   Past Medical History:  Diagnosis Date  . Arthritis    OA  . Breast cancer, stage 1 ([place]) 03/14/2011   left BREAST, AND 4 WEEKS RADIATION  . Chronic back pain    LOWER  . Heart murmur   . Hyperlipidemia   . Hypertension   . Hypothyroidism   . Raynaud disease   . Spinal headache 61 YRS AGO AFTER CHILDBIRTH  . TIA (transient ischemic attack) march 2012    Family History  Problem Relation Age of Onset  . Congestive Heart Failure Mother   . Cancer Maternal Grandmother   . Rheum arthritis Sister   . Healthy Sister   . Other Son     CHRONIC INFLAMMATORY DEMYELINATING POLYNEUROPATHY  . Healthy Son     Past Surgical History:  Procedure Laterality Date  . BREAST SURGERY Left 02/23/11   left lumpectomy  . CARDIAC CATHETERIZATION N/A 10/12/2016   Procedure: Left Heart Cath and Coronary Angiography;  Surgeon: Tanya Booze, MD;  Location: [facility];  Service: Cardiovascular;  Laterality: N/A;  . COLONSCOPY  2009  . EYE SURGERY Bilateral 2013   IOC LENS FOR CATARACTS  . FINGER SURGERY    . TONSILLECTOMY AND ADENOIDECTOMY    . TOTAL ABDOMINAL HYSTERECTOMY  1974   COMPLETE  . TOTAL HIP ARTHROPLASTY Left 11/04/2016   Procedure: LEFT TOTAL HIP ARTHROPLASTY ANTERIOR APPROACH;  Surgeon: Tanya Rossetti, MD;  Location: [facility];  Service: Orthopedics;  Laterality: Left;   Social History   Occupational History  . Not on file.   Social History Main Topics  . Smoking status: Former Smoker    Types: Cigarettes    Quit date: 09/20/1967  . Smokeless tobacco: Never Used     Comment: quit 1969  . Alcohol use No  . Drug use: No  . Sexual activity: Not on file

## 2016-12-20 ENCOUNTER — Telehealth (INDEPENDENT_AMBULATORY_CARE_PROVIDER_SITE_OTHER): Payer: Self-pay | Admitting: Orthopaedic Surgery

## 2016-12-20 ENCOUNTER — Other Ambulatory Visit (INDEPENDENT_AMBULATORY_CARE_PROVIDER_SITE_OTHER): Payer: Self-pay | Admitting: Physician Assistant

## 2016-12-20 MED ORDER — HYDROCODONE-ACETAMINOPHEN 5-325 MG PO TABS: 1.0000 | ORAL_TABLET | Freq: Four times a day (QID) | ORAL | 0 refills | Status: DC | PRN

## 2016-12-20 NOTE — Telephone Encounter (Signed)
Patient aware Rx ready at front desk  

## 2016-12-20 NOTE — Telephone Encounter (Signed)
Please advise 

## 2016-12-20 NOTE — Telephone Encounter (Signed)
Patient called needing Rx refilled (Hydrocodone) The number to contact patient is [phone]

## 2016-12-20 NOTE — Telephone Encounter (Signed)
On your desk

## 2016-12-21 NOTE — Telephone Encounter (Signed)
Patient gave verbal over the phone that Donavan Burnet can pickup her pain medication

## 2016-12-26 ENCOUNTER — Ambulatory Visit (INDEPENDENT_AMBULATORY_CARE_PROVIDER_SITE_OTHER): Payer: Medicare Other | Admitting: Orthopaedic Surgery

## 2016-12-26 DIAGNOSIS — Z96642 Presence of left artificial hip joint: Secondary | ICD-10-CM

## 2016-12-26 NOTE — Progress Notes (Signed)
The patient is a 83 year old who is now about 7 weeks status post a left total hip replacement. This is been a slow process for her in terms of recovery. She still on hydrocodone and still having a lot of pain.  On examination incision is healed over nicely. Is no evidence of infection. There is still slight seroma. She hollers to put her hip the range of motion. I'll see her standing and walking the revision or walker as well.  I gave her encouragement this pain will subside with time. We've x-rayed her hip several times postoperatively and everything is look good. On exam nothing standout to me. I told her weaning from narcotics will help her the most. She can stay on her 325 mg aspirin daily which she takes for previous TIAs. She also try Tylenol arthritis. I'll see her back in a month to see how she is doing overall

## 2017-01-05 DIAGNOSIS — L820 Inflamed seborrheic keratosis: Secondary | ICD-10-CM | POA: Diagnosis not present

## 2017-01-05 DIAGNOSIS — L661 Lichen planopilaris: Secondary | ICD-10-CM | POA: Diagnosis not present

## 2017-01-11 DIAGNOSIS — H00031 Abscess of right upper eyelid: Secondary | ICD-10-CM | POA: Diagnosis not present

## 2017-01-17 ENCOUNTER — Ambulatory Visit (HOSPITAL_COMMUNITY)
Admission: RE | Admit: 2017-01-17 | Discharge: 2017-01-17 | Disposition: A | Discharge status: Home / Self Care | Payer: Medicare Other | Source: Ambulatory Visit | Attending: Internal Medicine | Admitting: Internal Medicine

## 2017-01-17 ENCOUNTER — Encounter (HOSPITAL_COMMUNITY): Payer: Self-pay

## 2017-01-17 DIAGNOSIS — M810 Age-related osteoporosis without current pathological fracture: Secondary | ICD-10-CM | POA: Insufficient documentation

## 2017-01-17 MED ORDER — DENOSUMAB 60 MG/ML ~~LOC~~ SOLN
60.0000 mg | Freq: Once | SUBCUTANEOUS | Status: AC
  Administered 2017-01-17: 60 mg via SUBCUTANEOUS
  Filled 2017-01-17: qty 1

## 2017-01-17 NOTE — Progress Notes (Signed)
D/c instructions given on prolia.  Pt states she has had several times.  prolia 60mg  sq given to rt upper arm.  Next appointment for 08/21/17 at 1200 given to pt.  Pt given short stay phone number also.

## 2017-01-17 NOTE — Discharge Instructions (Signed)
Denosumab injection °What is this medicine? °DENOSUMAB (den oh sue mab) slows bone breakdown. Prolia is used to treat osteoporosis in women after menopause and in men. Xgeva is used to treat a high calcium level due to cancer and to prevent bone fractures and other bone problems caused by multiple myeloma or cancer bone metastases. Xgeva is also used to treat giant cell tumor of the bone. °This medicine may be used for other purposes; ask your health care provider or pharmacist if you have questions. °COMMON BRAND NAME(S): Prolia, XGEVA °What should I tell my health care provider before I take this medicine? °They need to know if you have any of these conditions: °-dental disease °-having surgery or tooth extraction °-infection °-kidney disease °-low levels of calcium or Vitamin D in the blood °-malnutrition °-on hemodialysis °-skin conditions or sensitivity °-thyroid or parathyroid disease °-an unusual reaction to denosumab, other medicines, foods, dyes, or preservatives °-pregnant or trying to get pregnant °-breast-feeding °How should I use this medicine? °This medicine is for injection under the skin. It is given by a health care professional in a hospital or clinic setting. °If you are getting Prolia, a special MedGuide will be given to you by the pharmacist with each prescription and refill. Be sure to read this information carefully each time. °For Prolia, talk to your pediatrician regarding the use of this medicine in children. Special care may be needed. For Xgeva, talk to your pediatrician regarding the use of this medicine in children. While this drug may be prescribed for children as young as 13 years for selected conditions, precautions do apply. °Overdosage: If you think you have taken too much of this medicine contact a poison control center or emergency room at once. °NOTE: This medicine is only for you. Do not share this medicine with others. °What if I miss a dose? °It is important not to miss your  dose. Call your doctor or health care professional if you are unable to keep an appointment. °What may interact with this medicine? °Do not take this medicine with any of the following medications: °-other medicines containing denosumab °This medicine may also interact with the following medications: °-medicines that lower your chance of fighting infection °-steroid medicines like prednisone or cortisone °This list may not describe all possible interactions. Give your health care provider a list of all the medicines, herbs, non-prescription drugs, or dietary supplements you use. Also tell them if you smoke, drink alcohol, or use illegal drugs. Some items may interact with your medicine. °What should I watch for while using this medicine? °Visit your doctor or health care professional for regular checks on your progress. Your doctor or health care professional may order blood tests and other tests to see how you are doing. °Call your doctor or health care professional for advice if you get a fever, chills or sore throat, or other symptoms of a cold or flu. Do not treat yourself. This drug may decrease your body's ability to fight infection. Try to avoid being around people who are sick. °You should make sure you get enough calcium and vitamin D while you are taking this medicine, unless your doctor tells you not to. Discuss the foods you eat and the vitamins you take with your health care professional. °See your dentist regularly. Brush and floss your teeth as directed. Before you have any dental work done, tell your dentist you are receiving this medicine. °Do not become pregnant while taking this medicine or for 5 months after stopping   it. Talk with your doctor or health care professional about your birth control options while taking this medicine. Women should inform their doctor if they wish to become pregnant or think they might be pregnant. There is a potential for serious side effects to an unborn child. Talk  to your health care professional or pharmacist for more information. What side effects may I notice from receiving this medicine? Side effects that you should report to your doctor or health care professional as soon as possible: -allergic reactions like skin rash, itching or hives, swelling of the face, lips, or tongue -bone pain -breathing problems -dizziness -jaw pain, especially after dental work -redness, blistering, peeling of the skin -signs and symptoms of infection like fever or chills; cough; sore throat; pain or trouble passing urine -signs of low calcium like fast heartbeat, muscle cramps or muscle pain; pain, tingling, numbness in the hands or feet; seizures -unusual bleeding or bruising -unusually weak or tired Side effects that usually do not require medical attention (report to your doctor or health care professional if they continue or are bothersome): -constipation -diarrhea -headache -joint pain -loss of appetite -muscle pain -runny nose -tiredness -upset stomach This list may not describe all possible side effects. Call your doctor for medical advice about side effects. You may report side effects to FDA at 1-800-FDA-1088. Where should I keep my medicine? This medicine is only given in a clinic, doctor's office, or other health care setting and will not be stored at home. NOTE: This sheet is a summary. It may not cover all possible information. If you have questions about this medicine, talk to your doctor, pharmacist, or health care provider.  2018 Elsevier/Gold Standard (2016-09-27 19:17:21)

## 2017-01-23 ENCOUNTER — Ambulatory Visit (INDEPENDENT_AMBULATORY_CARE_PROVIDER_SITE_OTHER): Payer: Medicare Other | Admitting: Orthopaedic Surgery

## 2017-01-23 DIAGNOSIS — Z96642 Presence of left artificial hip joint: Secondary | ICD-10-CM

## 2017-01-23 NOTE — Progress Notes (Signed)
The patient is now 2-1/2 months status post a left total hip replacement. She is finally making good progress. She uses a cane in public but not at home. She is 83 years old and is driving again. She reports still some mild to moderate pain.  On examination incision is well-healed. There is swelling posterior to the incision but is not a seroma at this point. We had drain several seromas from her hip and now this just firmness of the muscle and I gave her encouragement this will dissipate with time. She'll try heat over this area twice a day. She does have fluid range of motion of her hip as well as her likely treat equal.  At this point I really didn't see her back for 6 months unless she is having issues. At that visit I would like a low AP pelvis.

## 2017-01-30 DIAGNOSIS — H0011 Chalazion right upper eyelid: Secondary | ICD-10-CM | POA: Diagnosis not present

## 2017-02-03 ENCOUNTER — Other Ambulatory Visit: Payer: Self-pay | Admitting: Internal Medicine

## 2017-02-03 DIAGNOSIS — Z853 Personal history of malignant neoplasm of breast: Secondary | ICD-10-CM

## 2017-02-09 DIAGNOSIS — H0012 Chalazion right lower eyelid: Secondary | ICD-10-CM | POA: Diagnosis not present

## 2017-02-22 DIAGNOSIS — H01005 Unspecified blepharitis left lower eyelid: Secondary | ICD-10-CM | POA: Diagnosis not present

## 2017-03-06 DIAGNOSIS — R600 Localized edema: Secondary | ICD-10-CM | POA: Diagnosis not present

## 2017-03-06 DIAGNOSIS — E038 Other specified hypothyroidism: Secondary | ICD-10-CM | POA: Diagnosis not present

## 2017-03-06 DIAGNOSIS — R7301 Impaired fasting glucose: Secondary | ICD-10-CM | POA: Diagnosis not present

## 2017-03-06 DIAGNOSIS — M1990 Unspecified osteoarthritis, unspecified site: Secondary | ICD-10-CM | POA: Diagnosis not present

## 2017-03-06 DIAGNOSIS — I10 Essential (primary) hypertension: Secondary | ICD-10-CM | POA: Diagnosis not present

## 2017-03-06 DIAGNOSIS — Z6820 Body mass index (BMI) 20.0-20.9, adult: Secondary | ICD-10-CM | POA: Diagnosis not present

## 2017-03-07 ENCOUNTER — Other Ambulatory Visit (HOSPITAL_COMMUNITY): Payer: Self-pay | Admitting: Internal Medicine

## 2017-03-07 ENCOUNTER — Ambulatory Visit (HOSPITAL_COMMUNITY)
Admission: RE | Admit: 2017-03-07 | Discharge: 2017-03-07 | Disposition: A | Discharge status: Home / Self Care | Payer: Medicare Other | Source: Ambulatory Visit | Attending: Vascular Surgery | Admitting: Vascular Surgery

## 2017-03-07 DIAGNOSIS — R609 Edema, unspecified: Secondary | ICD-10-CM | POA: Insufficient documentation

## 2017-03-07 DIAGNOSIS — I8392 Asymptomatic varicose veins of left lower extremity: Secondary | ICD-10-CM | POA: Diagnosis not present

## 2017-03-08 DIAGNOSIS — L661 Lichen planopilaris: Secondary | ICD-10-CM | POA: Diagnosis not present

## 2017-03-08 DIAGNOSIS — L821 Other seborrheic keratosis: Secondary | ICD-10-CM | POA: Diagnosis not present

## 2017-03-15 ENCOUNTER — Ambulatory Visit
Admission: RE | Admit: 2017-03-15 | Discharge: 2017-03-15 | Disposition: A | Discharge status: Home / Self Care | Payer: Medicare Other | Source: Ambulatory Visit | Attending: Internal Medicine | Admitting: Internal Medicine

## 2017-03-15 DIAGNOSIS — Z853 Personal history of malignant neoplasm of breast: Secondary | ICD-10-CM

## 2017-03-15 DIAGNOSIS — R928 Other abnormal and inconclusive findings on diagnostic imaging of breast: Secondary | ICD-10-CM | POA: Diagnosis not present

## 2017-03-15 HISTORY — DX: Personal history of irradiation: Z92.3

## 2017-03-31 NOTE — Addendum Note (Signed)
Addendum  created 03/31/17 1707 by Lyndle Herrlich, MD   Sign clinical note

## 2017-03-31 NOTE — Anesthesia Postprocedure Evaluation (Signed)
Anesthesia Post Note  Patient: Tanya Morales  Procedure(s) Performed: Procedure(s) (LRB): LEFT TOTAL HIP ARTHROPLASTY ANTERIOR APPROACH (Left)     Anesthesia Post Evaluation  Last Vitals:  Vitals:   11/06/16 2242 11/07/16 0456  BP: (!) 133/57 (!) 161/55  Pulse: 85 88  Resp: 16 16  Temp: 36.9 C 37.2 C    Last Pain:  Vitals:   11/07/16 1245  TempSrc:   PainSc: 3                  Anabelen Kaminsky EDWARD

## 2017-06-07 DIAGNOSIS — L661 Lichen planopilaris: Secondary | ICD-10-CM | POA: Diagnosis not present

## 2017-06-20 DIAGNOSIS — Z23 Encounter for immunization: Secondary | ICD-10-CM | POA: Diagnosis not present

## 2017-07-19 DIAGNOSIS — R300 Dysuria: Secondary | ICD-10-CM | POA: Insufficient documentation

## 2017-07-19 DIAGNOSIS — N390 Urinary tract infection, site not specified: Secondary | ICD-10-CM | POA: Diagnosis not present

## 2017-07-26 ENCOUNTER — Ambulatory Visit (INDEPENDENT_AMBULATORY_CARE_PROVIDER_SITE_OTHER): Payer: Medicare Other | Admitting: Orthopaedic Surgery

## 2017-07-26 ENCOUNTER — Encounter (INDEPENDENT_AMBULATORY_CARE_PROVIDER_SITE_OTHER): Payer: Self-pay | Admitting: Orthopaedic Surgery

## 2017-07-26 ENCOUNTER — Ambulatory Visit (INDEPENDENT_AMBULATORY_CARE_PROVIDER_SITE_OTHER): Payer: Medicare Other

## 2017-07-26 DIAGNOSIS — Z96642 Presence of left artificial hip joint: Secondary | ICD-10-CM | POA: Diagnosis not present

## 2017-07-26 NOTE — Progress Notes (Signed)
   The patient is a 83 year old female who is 9 months status post left total hip arthroplasty.  She says her leg is stiff and she cannot put her shoe and sock on that she tries to be as active as possible.  She does report still some hip swelling.  On exam there is no significant seroma at all at this point.  There is slight soft tissue swelling but no redness.  She tolerates me putting her left hip to internal and external rotation.  Leg lengths show that she is equal.  An AP pelvis shows no gross findings.  There was a low cut of the calcar but the actual replacement itself in the left hip looks fine.  At this point she will follow-up as needed.  However she is having any issues at all with that hip she will let us know because certainly we can see loosening with components in this age group and certainly if she has any issues she will see [place] in a talk about this in detail.

## 2017-07-28 DIAGNOSIS — L661 Lichen planopilaris: Secondary | ICD-10-CM | POA: Diagnosis not present

## 2017-08-02 DIAGNOSIS — H40013 Open angle with borderline findings, low risk, bilateral: Secondary | ICD-10-CM | POA: Diagnosis not present

## 2017-08-15 DIAGNOSIS — H40013 Open angle with borderline findings, low risk, bilateral: Secondary | ICD-10-CM | POA: Diagnosis not present

## 2017-08-21 ENCOUNTER — Encounter (HOSPITAL_COMMUNITY): Payer: Self-pay

## 2017-08-21 ENCOUNTER — Ambulatory Visit (HOSPITAL_COMMUNITY)
Admission: RE | Admit: 2017-08-21 | Discharge: 2017-08-21 | Disposition: A | Discharge status: Home / Self Care | Payer: Medicare Other | Source: Ambulatory Visit | Attending: Internal Medicine | Admitting: Internal Medicine

## 2017-08-21 DIAGNOSIS — M810 Age-related osteoporosis without current pathological fracture: Secondary | ICD-10-CM | POA: Insufficient documentation

## 2017-08-21 MED ORDER — DENOSUMAB 60 MG/ML ~~LOC~~ SOLN
60.0000 mg | Freq: Once | SUBCUTANEOUS | Status: AC
  Administered 2017-08-21: 60 mg via SUBCUTANEOUS
  Filled 2017-08-21: qty 1

## 2017-08-21 NOTE — Discharge Instructions (Signed)
Denosumab injection / Prolia What is this medicine? DENOSUMAB (den oh sue mab) slows bone breakdown. Prolia is used to treat osteoporosis in women after menopause and in men. Delton See is used to treat a high calcium level due to cancer and to prevent bone fractures and other bone problems caused by multiple myeloma or cancer bone metastases. Delton See is also used to treat giant cell tumor of the bone. This medicine may be used for other purposes; ask your health care provider or pharmacist if you have questions. COMMON BRAND NAME(S): Prolia, XGEVA What should I tell my health care provider before I take this medicine? They need to know if you have any of these conditions: -dental disease -having surgery or tooth extraction -infection -kidney disease -low levels of calcium or Vitamin D in the blood -malnutrition -on hemodialysis -skin conditions or sensitivity -thyroid or parathyroid disease -an unusual reaction to denosumab, other medicines, foods, dyes, or preservatives -pregnant or trying to get pregnant -breast-feeding How should I use this medicine? This medicine is for injection under the skin. It is given by a health care professional in a hospital or clinic setting. If you are getting Prolia, a special MedGuide will be given to you by the pharmacist with each prescription and refill. Be sure to read this information carefully each time. For Prolia, talk to your pediatrician regarding the use of this medicine in children. Special care may be needed. For Delton See, talk to your pediatrician regarding the use of this medicine in children. While this drug may be prescribed for children as young as 13 years for selected conditions, precautions do apply. Overdosage: If you think you have taken too much of this medicine contact a poison control center or emergency room at once. NOTE: This medicine is only for you. Do not share this medicine with others. What if I miss a dose? It is important not to  miss your dose. Call your doctor or health care professional if you are unable to keep an appointment. What may interact with this medicine? Do not take this medicine with any of the following medications: -other medicines containing denosumab This medicine may also interact with the following medications: -medicines that lower your chance of fighting infection -steroid medicines like prednisone or cortisone This list may not describe all possible interactions. Give your health care provider a list of all the medicines, herbs, non-prescription drugs, or dietary supplements you use. Also tell them if you smoke, drink alcohol, or use illegal drugs. Some items may interact with your medicine. What should I watch for while using this medicine? Visit your doctor or health care professional for regular checks on your progress. Your doctor or health care professional may order blood tests and other tests to see how you are doing. Call your doctor or health care professional for advice if you get a fever, chills or sore throat, or other symptoms of a cold or flu. Do not treat yourself. This drug may decrease your body's ability to fight infection. Try to avoid being around people who are sick. You should make sure you get enough calcium and vitamin D while you are taking this medicine, unless your doctor tells you not to. Discuss the foods you eat and the vitamins you take with your health care professional. See your dentist regularly. Brush and floss your teeth as directed. Before you have any dental work done, tell your dentist you are receiving this medicine. Do not become pregnant while taking this medicine or for 5 months  after stopping it. Talk with your doctor or health care professional about your birth control options while taking this medicine. Women should inform their doctor if they wish to become pregnant or think they might be pregnant. There is a potential for serious side effects to an unborn  child. Talk to your health care professional or pharmacist for more information. What side effects may I notice from receiving this medicine? Side effects that you should report to your doctor or health care professional as soon as possible: -allergic reactions like skin rash, itching or hives, swelling of the face, lips, or tongue -bone pain -breathing problems -dizziness -jaw pain, especially after dental work -redness, blistering, peeling of the skin -signs and symptoms of infection like fever or chills; cough; sore throat; pain or trouble passing urine -signs of low calcium like fast heartbeat, muscle cramps or muscle pain; pain, tingling, numbness in the hands or feet; seizures -unusual bleeding or bruising -unusually weak or tired Side effects that usually do not require medical attention (report to your doctor or health care professional if they continue or are bothersome): -constipation -diarrhea -headache -joint pain -loss of appetite -muscle pain -runny nose -tiredness -upset stomach This list may not describe all possible side effects. Call your doctor for medical advice about side effects. You may report side effects to FDA at 1-800-FDA-1088. Where should I keep my medicine? This medicine is only given in a clinic, doctor's office, or other health care setting and will not be stored at home. NOTE: This sheet is a summary. It may not cover all possible information. If you have questions about this medicine, talk to your doctor, pharmacist, or health care provider.  2018 Elsevier/Gold Standard (2016-09-27 19:17:21)

## 2017-09-06 DIAGNOSIS — L661 Lichen planopilaris: Secondary | ICD-10-CM | POA: Diagnosis not present

## 2017-09-19 DIAGNOSIS — R42 Dizziness and giddiness: Secondary | ICD-10-CM

## 2017-09-19 HISTORY — DX: Dizziness and giddiness: R42

## 2017-09-26 DIAGNOSIS — H02052 Trichiasis without entropian right lower eyelid: Secondary | ICD-10-CM | POA: Diagnosis not present

## 2017-10-06 DIAGNOSIS — E038 Other specified hypothyroidism: Secondary | ICD-10-CM | POA: Diagnosis not present

## 2017-10-06 DIAGNOSIS — M859 Disorder of bone density and structure, unspecified: Secondary | ICD-10-CM | POA: Diagnosis not present

## 2017-10-06 DIAGNOSIS — D539 Nutritional anemia, unspecified: Secondary | ICD-10-CM | POA: Diagnosis not present

## 2017-10-06 DIAGNOSIS — R7301 Impaired fasting glucose: Secondary | ICD-10-CM | POA: Diagnosis not present

## 2017-10-06 DIAGNOSIS — R82998 Other abnormal findings in urine: Secondary | ICD-10-CM | POA: Diagnosis not present

## 2017-10-06 DIAGNOSIS — I10 Essential (primary) hypertension: Secondary | ICD-10-CM | POA: Diagnosis not present

## 2017-10-06 DIAGNOSIS — E7849 Other hyperlipidemia: Secondary | ICD-10-CM | POA: Diagnosis not present

## 2017-10-06 DIAGNOSIS — D538 Other specified nutritional anemias: Secondary | ICD-10-CM | POA: Diagnosis not present

## 2017-10-13 DIAGNOSIS — E7849 Other hyperlipidemia: Secondary | ICD-10-CM | POA: Diagnosis not present

## 2017-10-13 DIAGNOSIS — G458 Other transient cerebral ischemic attacks and related syndromes: Secondary | ICD-10-CM | POA: Diagnosis not present

## 2017-10-13 DIAGNOSIS — R1032 Left lower quadrant pain: Secondary | ICD-10-CM | POA: Diagnosis not present

## 2017-10-13 DIAGNOSIS — Z0000 Encounter for general adult medical examination without abnormal findings: Secondary | ICD-10-CM | POA: Diagnosis not present

## 2017-10-13 DIAGNOSIS — M859 Disorder of bone density and structure, unspecified: Secondary | ICD-10-CM | POA: Diagnosis not present

## 2017-10-13 DIAGNOSIS — M545 Low back pain: Secondary | ICD-10-CM | POA: Diagnosis not present

## 2017-10-13 DIAGNOSIS — C50919 Malignant neoplasm of unspecified site of unspecified female breast: Secondary | ICD-10-CM | POA: Diagnosis not present

## 2017-10-13 DIAGNOSIS — R7301 Impaired fasting glucose: Secondary | ICD-10-CM | POA: Diagnosis not present

## 2017-10-13 DIAGNOSIS — I10 Essential (primary) hypertension: Secondary | ICD-10-CM | POA: Diagnosis not present

## 2017-10-13 DIAGNOSIS — Z1389 Encounter for screening for other disorder: Secondary | ICD-10-CM | POA: Diagnosis not present

## 2017-10-13 DIAGNOSIS — M1990 Unspecified osteoarthritis, unspecified site: Secondary | ICD-10-CM | POA: Diagnosis not present

## 2017-10-13 DIAGNOSIS — Z6823 Body mass index (BMI) 23.0-23.9, adult: Secondary | ICD-10-CM | POA: Diagnosis not present

## 2017-11-21 DIAGNOSIS — H40013 Open angle with borderline findings, low risk, bilateral: Secondary | ICD-10-CM | POA: Diagnosis not present

## 2017-12-06 DIAGNOSIS — L433 Subacute (active) lichen planus: Secondary | ICD-10-CM | POA: Diagnosis not present

## 2018-02-06 ENCOUNTER — Other Ambulatory Visit: Payer: Self-pay | Admitting: Internal Medicine

## 2018-02-06 DIAGNOSIS — Z1231 Encounter for screening mammogram for malignant neoplasm of breast: Secondary | ICD-10-CM

## 2018-02-14 DIAGNOSIS — L570 Actinic keratosis: Secondary | ICD-10-CM | POA: Diagnosis not present

## 2018-02-14 DIAGNOSIS — L821 Other seborrheic keratosis: Secondary | ICD-10-CM | POA: Diagnosis not present

## 2018-02-14 DIAGNOSIS — D692 Other nonthrombocytopenic purpura: Secondary | ICD-10-CM | POA: Diagnosis not present

## 2018-02-14 DIAGNOSIS — L814 Other melanin hyperpigmentation: Secondary | ICD-10-CM | POA: Diagnosis not present

## 2018-03-01 DIAGNOSIS — M25552 Pain in left hip: Secondary | ICD-10-CM | POA: Diagnosis not present

## 2018-03-01 DIAGNOSIS — R05 Cough: Secondary | ICD-10-CM | POA: Diagnosis not present

## 2018-03-01 DIAGNOSIS — R42 Dizziness and giddiness: Secondary | ICD-10-CM | POA: Diagnosis not present

## 2018-03-01 DIAGNOSIS — Z6824 Body mass index (BMI) 24.0-24.9, adult: Secondary | ICD-10-CM | POA: Diagnosis not present

## 2018-03-01 DIAGNOSIS — R059 Cough, unspecified: Secondary | ICD-10-CM | POA: Insufficient documentation

## 2018-03-07 DIAGNOSIS — L661 Lichen planopilaris: Secondary | ICD-10-CM | POA: Diagnosis not present

## 2018-03-12 ENCOUNTER — Ambulatory Visit (HOSPITAL_COMMUNITY): Payer: Medicare Other

## 2018-03-16 ENCOUNTER — Ambulatory Visit
Admission: RE | Admit: 2018-03-16 | Discharge: 2018-03-16 | Disposition: A | Discharge status: Home / Self Care | Payer: Medicare Other | Source: Ambulatory Visit | Attending: Internal Medicine | Admitting: Internal Medicine

## 2018-03-16 DIAGNOSIS — Z1231 Encounter for screening mammogram for malignant neoplasm of breast: Secondary | ICD-10-CM

## 2018-03-29 DIAGNOSIS — H8113 Benign paroxysmal vertigo, bilateral: Secondary | ICD-10-CM | POA: Diagnosis not present

## 2018-03-29 DIAGNOSIS — M25552 Pain in left hip: Secondary | ICD-10-CM | POA: Diagnosis not present

## 2018-04-04 DIAGNOSIS — H8113 Benign paroxysmal vertigo, bilateral: Secondary | ICD-10-CM | POA: Diagnosis not present

## 2018-04-04 DIAGNOSIS — M25552 Pain in left hip: Secondary | ICD-10-CM | POA: Diagnosis not present

## 2018-04-12 DIAGNOSIS — M25552 Pain in left hip: Secondary | ICD-10-CM | POA: Diagnosis not present

## 2018-04-12 DIAGNOSIS — H8113 Benign paroxysmal vertigo, bilateral: Secondary | ICD-10-CM | POA: Diagnosis not present

## 2018-04-18 DIAGNOSIS — M859 Disorder of bone density and structure, unspecified: Secondary | ICD-10-CM | POA: Diagnosis not present

## 2018-04-18 DIAGNOSIS — C50919 Malignant neoplasm of unspecified site of unspecified female breast: Secondary | ICD-10-CM | POA: Diagnosis not present

## 2018-04-18 DIAGNOSIS — Z6824 Body mass index (BMI) 24.0-24.9, adult: Secondary | ICD-10-CM | POA: Diagnosis not present

## 2018-04-18 DIAGNOSIS — I10 Essential (primary) hypertension: Secondary | ICD-10-CM | POA: Diagnosis not present

## 2018-04-18 DIAGNOSIS — E038 Other specified hypothyroidism: Secondary | ICD-10-CM | POA: Diagnosis not present

## 2018-04-18 DIAGNOSIS — R05 Cough: Secondary | ICD-10-CM | POA: Diagnosis not present

## 2018-04-25 DIAGNOSIS — H8113 Benign paroxysmal vertigo, bilateral: Secondary | ICD-10-CM | POA: Diagnosis not present

## 2018-04-25 DIAGNOSIS — M25552 Pain in left hip: Secondary | ICD-10-CM | POA: Diagnosis not present

## 2018-05-02 DIAGNOSIS — H8113 Benign paroxysmal vertigo, bilateral: Secondary | ICD-10-CM | POA: Diagnosis not present

## 2018-05-02 DIAGNOSIS — M25552 Pain in left hip: Secondary | ICD-10-CM | POA: Diagnosis not present

## 2018-05-09 DIAGNOSIS — H8113 Benign paroxysmal vertigo, bilateral: Secondary | ICD-10-CM | POA: Diagnosis not present

## 2018-05-09 DIAGNOSIS — M25552 Pain in left hip: Secondary | ICD-10-CM | POA: Diagnosis not present

## 2018-05-16 DIAGNOSIS — M25552 Pain in left hip: Secondary | ICD-10-CM | POA: Diagnosis not present

## 2018-05-16 DIAGNOSIS — H8113 Benign paroxysmal vertigo, bilateral: Secondary | ICD-10-CM | POA: Diagnosis not present

## 2018-05-22 DIAGNOSIS — H8113 Benign paroxysmal vertigo, bilateral: Secondary | ICD-10-CM | POA: Diagnosis not present

## 2018-05-22 DIAGNOSIS — M25552 Pain in left hip: Secondary | ICD-10-CM | POA: Diagnosis not present

## 2018-05-24 ENCOUNTER — Ambulatory Visit (HOSPITAL_COMMUNITY)
Admission: RE | Admit: 2018-05-24 | Discharge: 2018-05-24 | Disposition: A | Discharge status: Home / Self Care | Payer: Medicare Other | Source: Ambulatory Visit | Attending: Internal Medicine | Admitting: Internal Medicine

## 2018-05-24 ENCOUNTER — Encounter (HOSPITAL_COMMUNITY): Payer: Self-pay

## 2018-05-24 DIAGNOSIS — M810 Age-related osteoporosis without current pathological fracture: Secondary | ICD-10-CM | POA: Diagnosis not present

## 2018-05-24 HISTORY — DX: Dizziness and giddiness: R42

## 2018-05-24 MED ORDER — DENOSUMAB 60 MG/ML ~~LOC~~ SOSY
60.0000 mg | PREFILLED_SYRINGE | Freq: Once | SUBCUTANEOUS | Status: AC
  Administered 2018-05-24: 60 mg via SUBCUTANEOUS

## 2018-05-24 MED ORDER — DENOSUMAB 60 MG/ML ~~LOC~~ SOSY
PREFILLED_SYRINGE | SUBCUTANEOUS | Status: AC
  Administered 2018-05-24: 60 mg via SUBCUTANEOUS
  Filled 2018-05-24: qty 1

## 2018-05-24 NOTE — Discharge Instructions (Signed)
Denosumab injection / Prolia What is this medicine? DENOSUMAB (den oh sue mab) slows bone breakdown. Prolia is used to treat osteoporosis in women after menopause and in men. Delton See is used to treat a high calcium level due to cancer and to prevent bone fractures and other bone problems caused by multiple myeloma or cancer bone metastases. Delton See is also used to treat giant cell tumor of the bone. This medicine may be used for other purposes; ask your health care provider or pharmacist if you have questions. COMMON BRAND NAME(S): Prolia, XGEVA What should I tell my health care provider before I take this medicine? They need to know if you have any of these conditions: -dental disease -having surgery or tooth extraction -infection -kidney disease -low levels of calcium or Vitamin D in the blood -malnutrition -on hemodialysis -skin conditions or sensitivity -thyroid or parathyroid disease -an unusual reaction to denosumab, other medicines, foods, dyes, or preservatives -pregnant or trying to get pregnant -breast-feeding How should I use this medicine? This medicine is for injection under the skin. It is given by a health care professional in a hospital or clinic setting. If you are getting Prolia, a special MedGuide will be given to you by the pharmacist with each prescription and refill. Be sure to read this information carefully each time. For Prolia, talk to your pediatrician regarding the use of this medicine in children. Special care may be needed. For Delton See, talk to your pediatrician regarding the use of this medicine in children. While this drug may be prescribed for children as young as 13 years for selected conditions, precautions do apply. Overdosage: If you think you have taken too much of this medicine contact a poison control center or emergency room at once. NOTE: This medicine is only for you. Do not share this medicine with others. What if I miss a dose? It is important not to  miss your dose. Call your doctor or health care professional if you are unable to keep an appointment. What may interact with this medicine? Do not take this medicine with any of the following medications: -other medicines containing denosumab This medicine may also interact with the following medications: -medicines that lower your chance of fighting infection -steroid medicines like prednisone or cortisone This list may not describe all possible interactions. Give your health care provider a list of all the medicines, herbs, non-prescription drugs, or dietary supplements you use. Also tell them if you smoke, drink alcohol, or use illegal drugs. Some items may interact with your medicine. What should I watch for while using this medicine? Visit your doctor or health care professional for regular checks on your progress. Your doctor or health care professional may order blood tests and other tests to see how you are doing. Call your doctor or health care professional for advice if you get a fever, chills or sore throat, or other symptoms of a cold or flu. Do not treat yourself. This drug may decrease your body's ability to fight infection. Try to avoid being around people who are sick. You should make sure you get enough calcium and vitamin D while you are taking this medicine, unless your doctor tells you not to. Discuss the foods you eat and the vitamins you take with your health care professional. See your dentist regularly. Brush and floss your teeth as directed. Before you have any dental work done, tell your dentist you are receiving this medicine. Do not become pregnant while taking this medicine or for 5 months  after stopping it. Talk with your doctor or health care professional about your birth control options while taking this medicine. Women should inform their doctor if they wish to become pregnant or think they might be pregnant. There is a potential for serious side effects to an unborn  child. Talk to your health care professional or pharmacist for more information. What side effects may I notice from receiving this medicine? Side effects that you should report to your doctor or health care professional as soon as possible: -allergic reactions like skin rash, itching or hives, swelling of the face, lips, or tongue -bone pain -breathing problems -dizziness -jaw pain, especially after dental work -redness, blistering, peeling of the skin -signs and symptoms of infection like fever or chills; cough; sore throat; pain or trouble passing urine -signs of low calcium like fast heartbeat, muscle cramps or muscle pain; pain, tingling, numbness in the hands or feet; seizures -unusual bleeding or bruising -unusually weak or tired Side effects that usually do not require medical attention (report to your doctor or health care professional if they continue or are bothersome): -constipation -diarrhea -headache -joint pain -loss of appetite -muscle pain -runny nose -tiredness -upset stomach This list may not describe all possible side effects. Call your doctor for medical advice about side effects. You may report side effects to FDA at 1-800-FDA-1088. Where should I keep my medicine? This medicine is only given in a clinic, doctor's office, or other health care setting and will not be stored at home. NOTE: This sheet is a summary. It may not cover all possible information. If you have questions about this medicine, talk to your doctor, pharmacist, or health care provider.  2018 Elsevier/Gold Standard (2016-09-27 19:17:21)

## 2018-05-28 ENCOUNTER — Ambulatory Visit (INDEPENDENT_AMBULATORY_CARE_PROVIDER_SITE_OTHER): Payer: Medicare Other | Admitting: Podiatry

## 2018-05-28 ENCOUNTER — Encounter: Payer: Self-pay | Admitting: Podiatry

## 2018-05-28 DIAGNOSIS — Q828 Other specified congenital malformations of skin: Secondary | ICD-10-CM

## 2018-05-28 DIAGNOSIS — M19079 Primary osteoarthritis, unspecified ankle and foot: Secondary | ICD-10-CM

## 2018-05-28 DIAGNOSIS — L600 Ingrowing nail: Secondary | ICD-10-CM | POA: Diagnosis not present

## 2018-05-28 NOTE — Progress Notes (Signed)
Subjective:    Patient ID: Tanya Morales, female    DOB: [birth year], 84 y.o.   MRN: [id number]  HPI 84 year old female presents the office today for main concern of a callus or pain to the left third toe.  She states she cannot see the bottom of her toe but she thinks she has a callus in this area as it causes pain when she walks.  She denies any recent injury or trauma to her feet.  She also has secondary concerns of ingrown toenail to the right big toe which is been ongoing since she was a teenager and she needs frequent pedicures to help with this.  Denies any changes to this and she denies any redness or drainage or any swelling.   Review of Systems  All other systems reviewed and are negative.  Past Medical History:  Diagnosis Date  . Arthritis    OA  . Breast cancer, stage 1 ([place]) 03/14/2011   left BREAST, AND 4 WEEKS RADIATION  . Chronic back pain    LOWER  . Dizziness of unknown cause 2019  . Heart murmur   . Hyperlipidemia   . Hypertension   . Hypothyroidism   . Personal history of radiation therapy   . Raynaud disease   . Spinal headache 61 YRS AGO AFTER CHILDBIRTH  . TIA (transient ischemic attack) march 2012    Past Surgical History:  Procedure Laterality Date  . BREAST LUMPECTOMY Left 2012  . BREAST SURGERY Left 02/23/11   left lumpectomy  . CARDIAC CATHETERIZATION N/A 10/12/2016   Procedure: Left Heart Cath and Coronary Angiography;  Surgeon: Jettie Booze, MD;  Location: [facility];  Service: Cardiovascular;  Laterality: N/A;  . COLONSCOPY  2009  . EYE SURGERY Bilateral 2013   IOC LENS FOR CATARACTS  . FINGER SURGERY    . TONSILLECTOMY AND ADENOIDECTOMY    . TOTAL ABDOMINAL HYSTERECTOMY  1974   COMPLETE  . TOTAL HIP ARTHROPLASTY Left 11/04/2016   Procedure: LEFT TOTAL HIP ARTHROPLASTY ANTERIOR APPROACH;  Surgeon: Mcarthur Rossetti, MD;  Location: [facility];  Service: Orthopedics;  Laterality: Left;     Current Outpatient Medications:    .  amLODipine (NORVASC) 5 MG tablet, Take 5 mg by mouth every morning. , Disp: , Rfl:  .  aspirin 325 MG tablet, Take 81 mg by mouth daily. , Disp: , Rfl:  .  Calcium-Magnesium (CAL-MAG) 500-250 MG TABS, Take 1 tablet by mouth daily. , Disp: , Rfl:  .  cholecalciferol (VITAMIN D) 400 UNITS TABS tablet, Take 400 Units by mouth daily. , Disp: , Rfl:  .  co-enzyme Q-10 30 MG capsule, Take 30 mg by mouth daily., Disp: , Rfl:  .  denosumab (PROLIA) 60 MG/ML SOLN injection, Inject 60 mg into the skin every 6 (six) months. Administer in upper arm, thigh, or abdomen, Disp: , Rfl:  .  HYDROcodone-acetaminophen (NORCO/VICODIN) 5-325 MG tablet, Take 1 tablet by mouth every 6 (six) hours as needed for moderate pain., Disp: 40 tablet, Rfl: 0 .  levothyroxine (SYNTHROID, LEVOTHROID) 50 MCG tablet, Take 50 mcg by mouth daily before breakfast. , Disp: , Rfl:  .  methocarbamol (ROBAXIN) 500 MG tablet, Take 1 tablet (500 mg total) by mouth every 6 (six) hours as needed for muscle spasms. (Patient not taking: Reported on 12/26/2016), Disp: 60 tablet, Rfl: 1 .  Multiple Vitamin (MULTIVITAMIN WITH MINERALS) TABS tablet, Take 1 tablet by mouth daily., Disp: , Rfl:  .  nystatin (MYCOSTATIN) 100000 UNIT/ML suspension, Take 5 mLs by mouth 4 (four) times daily., Disp: , Rfl:  .  olmesartan (BENICAR) 20 MG tablet, Take 20 mg by mouth every morning. , Disp: , Rfl:  .  ondansetron (ZOFRAN) 4 MG tablet, Take 4 mg by mouth every 6 (six) hours as needed for nausea or vomiting., Disp: , Rfl:  .  oxyCODONE-acetaminophen (ROXICET) 5-325 MG tablet, Take 1-2 tablets by mouth every 6 (six) hours as needed. (Patient not taking: Reported on 12/26/2016), Disp: 60 tablet, Rfl: 0 .  Probiotic Product (PROBIOTIC PO), Take 1 capsule by mouth daily., Disp: , Rfl:  .  simvastatin (ZOCOR) 10 MG tablet, Take 10 mg by mouth at bedtime. , Disp: , Rfl:  .  tiZANidine (ZANAFLEX) 4 MG tablet, 1 po BID as needed for spasms (Patient not taking: Reported on  12/26/2016), Disp: 60 tablet, Rfl: 0 .  vitamin C (ASCORBIC ACID) 500 MG tablet, Take 500 mg by mouth daily.  , Disp: , Rfl:   Allergies  Allergen Reactions  . Celebrex [Celecoxib]     COULD NOT BREATHE  . Epinephrine     COULD NOT STAND, FELT BAD         Objective:   Physical Exam  General: AAO x3, NAD  Dermatological: Thick hyperkeratotic lesion plantar aspect left second toe at the PIPJ.  No underlying ulceration drainage or any signs of infection noted today.  Mild incurvation to the right hallux toenail however there is no pain in the ear and there is no edema, erythema or any signs of infection and this is been a chronic issue for many years.  Vascular: Dorsalis Pedis artery and Posterior Tibial artery pedal pulses are 2/4 bilateral with immedate capillary fill time. Pedal hair growth present. No varicosities and no lower extremity edema present bilateral. There is no pain with calf compression, swelling, warmth, erythema.   Neruologic: Grossly intact via light touch bilateral. Protective threshold with Semmes Wienstein monofilament intact to all pedal sites bilateral.   Musculoskeletal: Arthritic changes present in the left third toe.  Tenderness the hyperkeratotic lesion plantar left third toe but no other areas of tenderness identified otherwise.  Muscular strength 5/5 in all groups tested bilateral.      Assessment & Plan:  84 year old female with symptomatic hyperkeratotic lesion left third toe with arthritis to the toe chronic ingrown toenail right hallux -Treatment options discussed including all alternatives, risks, and complications -Etiology of symptoms were discussed - I debrided the hyperkeratotic lesion to the any complications or bleeding.  Also discussed arthritis of the toe.  Dispensed offloading pads for her but she is difficult time.  Discussed putting a memory foam or gel insert inside of her shoes or shoes, softer insole to help take pressure off the toe.    -Regards to the right hallux toenail this was a secondary thing for her.  There is been a chronic issue for several years and she declines a partial nail avulsion today.  Trula Slade DPM

## 2018-05-29 DIAGNOSIS — H40043 Steroid responder, bilateral: Secondary | ICD-10-CM | POA: Diagnosis not present

## 2018-05-30 DIAGNOSIS — M25552 Pain in left hip: Secondary | ICD-10-CM | POA: Diagnosis not present

## 2018-05-30 DIAGNOSIS — H8113 Benign paroxysmal vertigo, bilateral: Secondary | ICD-10-CM | POA: Diagnosis not present

## 2018-06-06 DIAGNOSIS — H8113 Benign paroxysmal vertigo, bilateral: Secondary | ICD-10-CM | POA: Diagnosis not present

## 2018-06-06 DIAGNOSIS — M25552 Pain in left hip: Secondary | ICD-10-CM | POA: Diagnosis not present

## 2018-06-08 DIAGNOSIS — Z23 Encounter for immunization: Secondary | ICD-10-CM | POA: Diagnosis not present

## 2018-06-18 DIAGNOSIS — H8113 Benign paroxysmal vertigo, bilateral: Secondary | ICD-10-CM | POA: Diagnosis not present

## 2018-06-18 DIAGNOSIS — M25552 Pain in left hip: Secondary | ICD-10-CM | POA: Diagnosis not present

## 2018-06-20 DIAGNOSIS — L821 Other seborrheic keratosis: Secondary | ICD-10-CM | POA: Diagnosis not present

## 2018-06-20 DIAGNOSIS — L661 Lichen planopilaris: Secondary | ICD-10-CM | POA: Diagnosis not present

## 2018-06-27 DIAGNOSIS — M25552 Pain in left hip: Secondary | ICD-10-CM | POA: Diagnosis not present

## 2018-06-27 DIAGNOSIS — H8113 Benign paroxysmal vertigo, bilateral: Secondary | ICD-10-CM | POA: Diagnosis not present

## 2018-07-04 DIAGNOSIS — M25552 Pain in left hip: Secondary | ICD-10-CM | POA: Diagnosis not present

## 2018-07-04 DIAGNOSIS — H8113 Benign paroxysmal vertigo, bilateral: Secondary | ICD-10-CM | POA: Diagnosis not present

## 2018-08-29 DIAGNOSIS — L438 Other lichen planus: Secondary | ICD-10-CM | POA: Diagnosis not present

## 2018-08-29 DIAGNOSIS — D485 Neoplasm of uncertain behavior of skin: Secondary | ICD-10-CM | POA: Diagnosis not present

## 2018-08-29 DIAGNOSIS — C44722 Squamous cell carcinoma of skin of right lower limb, including hip: Secondary | ICD-10-CM | POA: Diagnosis not present

## 2018-09-26 DIAGNOSIS — C44722 Squamous cell carcinoma of skin of right lower limb, including hip: Secondary | ICD-10-CM | POA: Diagnosis not present

## 2018-09-26 DIAGNOSIS — L661 Lichen planopilaris: Secondary | ICD-10-CM | POA: Diagnosis not present

## 2018-10-01 DIAGNOSIS — H00035 Abscess of left lower eyelid: Secondary | ICD-10-CM | POA: Diagnosis not present

## 2018-10-10 DIAGNOSIS — H00035 Abscess of left lower eyelid: Secondary | ICD-10-CM | POA: Diagnosis not present

## 2018-10-31 DIAGNOSIS — H00035 Abscess of left lower eyelid: Secondary | ICD-10-CM | POA: Diagnosis not present

## 2018-11-08 DIAGNOSIS — M859 Disorder of bone density and structure, unspecified: Secondary | ICD-10-CM | POA: Diagnosis not present

## 2018-11-08 DIAGNOSIS — R82998 Other abnormal findings in urine: Secondary | ICD-10-CM | POA: Diagnosis not present

## 2018-11-08 DIAGNOSIS — R7301 Impaired fasting glucose: Secondary | ICD-10-CM | POA: Diagnosis not present

## 2018-11-08 DIAGNOSIS — E7849 Other hyperlipidemia: Secondary | ICD-10-CM | POA: Diagnosis not present

## 2018-11-08 DIAGNOSIS — D538 Other specified nutritional anemias: Secondary | ICD-10-CM | POA: Diagnosis not present

## 2018-11-08 DIAGNOSIS — D539 Nutritional anemia, unspecified: Secondary | ICD-10-CM | POA: Diagnosis not present

## 2018-11-08 DIAGNOSIS — I10 Essential (primary) hypertension: Secondary | ICD-10-CM | POA: Diagnosis not present

## 2018-11-08 DIAGNOSIS — E038 Other specified hypothyroidism: Secondary | ICD-10-CM | POA: Diagnosis not present

## 2018-11-15 DIAGNOSIS — E7849 Other hyperlipidemia: Secondary | ICD-10-CM | POA: Diagnosis not present

## 2018-11-15 DIAGNOSIS — R809 Proteinuria, unspecified: Secondary | ICD-10-CM | POA: Insufficient documentation

## 2018-11-15 DIAGNOSIS — F418 Other specified anxiety disorders: Secondary | ICD-10-CM | POA: Diagnosis not present

## 2018-11-15 DIAGNOSIS — M25552 Pain in left hip: Secondary | ICD-10-CM | POA: Diagnosis not present

## 2018-11-15 DIAGNOSIS — Z1331 Encounter for screening for depression: Secondary | ICD-10-CM | POA: Diagnosis not present

## 2018-11-15 DIAGNOSIS — Z0000 Encounter for general adult medical examination without abnormal findings: Secondary | ICD-10-CM | POA: Diagnosis not present

## 2018-11-15 DIAGNOSIS — I10 Essential (primary) hypertension: Secondary | ICD-10-CM | POA: Diagnosis not present

## 2018-11-15 DIAGNOSIS — R42 Dizziness and giddiness: Secondary | ICD-10-CM | POA: Diagnosis not present

## 2018-11-15 DIAGNOSIS — G458 Other transient cerebral ischemic attacks and related syndromes: Secondary | ICD-10-CM | POA: Diagnosis not present

## 2018-11-15 DIAGNOSIS — R600 Localized edema: Secondary | ICD-10-CM | POA: Diagnosis not present

## 2018-11-15 DIAGNOSIS — C50919 Malignant neoplasm of unspecified site of unspecified female breast: Secondary | ICD-10-CM | POA: Diagnosis not present

## 2018-11-15 DIAGNOSIS — B370 Candidal stomatitis: Secondary | ICD-10-CM | POA: Diagnosis not present

## 2018-11-15 DIAGNOSIS — R110 Nausea: Secondary | ICD-10-CM | POA: Diagnosis not present

## 2018-12-17 ENCOUNTER — Ambulatory Visit (HOSPITAL_COMMUNITY): Payer: Medicare Other

## 2019-01-16 ENCOUNTER — Ambulatory Visit (HOSPITAL_COMMUNITY)
Admission: RE | Admit: 2019-01-16 | Discharge: 2019-01-16 | Disposition: A | Discharge status: Home / Self Care | Payer: Medicare Other | Source: Ambulatory Visit | Attending: Internal Medicine | Admitting: Internal Medicine

## 2019-01-16 ENCOUNTER — Other Ambulatory Visit: Payer: Self-pay

## 2019-01-16 ENCOUNTER — Encounter (HOSPITAL_COMMUNITY): Payer: Medicare Other

## 2019-01-16 ENCOUNTER — Ambulatory Visit (HOSPITAL_COMMUNITY): Payer: Medicare Other

## 2019-01-16 ENCOUNTER — Encounter (HOSPITAL_COMMUNITY): Payer: Self-pay

## 2019-01-16 DIAGNOSIS — M810 Age-related osteoporosis without current pathological fracture: Secondary | ICD-10-CM | POA: Insufficient documentation

## 2019-01-16 HISTORY — DX: Unspecified dislocation of right shoulder joint, subsequent encounter: S43.004D

## 2019-01-16 MED ORDER — DENOSUMAB 60 MG/ML ~~LOC~~ SOSY
60.0000 mg | PREFILLED_SYRINGE | Freq: Once | SUBCUTANEOUS | Status: AC
  Administered 2019-01-16: 60 mg via SUBCUTANEOUS
  Filled 2019-01-16: qty 1

## 2019-01-23 DIAGNOSIS — L661 Lichen planopilaris: Secondary | ICD-10-CM | POA: Diagnosis not present

## 2019-01-23 DIAGNOSIS — Z85828 Personal history of other malignant neoplasm of skin: Secondary | ICD-10-CM | POA: Diagnosis not present

## 2019-01-23 DIAGNOSIS — L821 Other seborrheic keratosis: Secondary | ICD-10-CM | POA: Diagnosis not present

## 2019-02-06 ENCOUNTER — Other Ambulatory Visit: Payer: Self-pay | Admitting: Internal Medicine

## 2019-02-06 DIAGNOSIS — Z1231 Encounter for screening mammogram for malignant neoplasm of breast: Secondary | ICD-10-CM

## 2019-03-29 ENCOUNTER — Ambulatory Visit: Payer: Medicare Other

## 2019-04-02 ENCOUNTER — Ambulatory Visit
Admission: RE | Admit: 2019-04-02 | Discharge: 2019-04-02 | Disposition: A | Discharge status: Home / Self Care | Payer: Medicare Other | Source: Ambulatory Visit | Attending: Internal Medicine | Admitting: Internal Medicine

## 2019-04-02 ENCOUNTER — Other Ambulatory Visit: Payer: Self-pay

## 2019-04-02 DIAGNOSIS — Z1231 Encounter for screening mammogram for malignant neoplasm of breast: Secondary | ICD-10-CM

## 2019-04-03 DIAGNOSIS — L821 Other seborrheic keratosis: Secondary | ICD-10-CM | POA: Diagnosis not present

## 2019-04-03 DIAGNOSIS — L661 Lichen planopilaris: Secondary | ICD-10-CM | POA: Diagnosis not present

## 2019-04-03 DIAGNOSIS — L814 Other melanin hyperpigmentation: Secondary | ICD-10-CM | POA: Diagnosis not present

## 2019-04-15 ENCOUNTER — Other Ambulatory Visit: Payer: Self-pay | Admitting: Internal Medicine

## 2019-04-15 DIAGNOSIS — Z1231 Encounter for screening mammogram for malignant neoplasm of breast: Secondary | ICD-10-CM

## 2019-04-23 ENCOUNTER — Ambulatory Visit: Payer: Medicare Other

## 2019-05-14 DIAGNOSIS — F419 Anxiety disorder, unspecified: Secondary | ICD-10-CM | POA: Diagnosis not present

## 2019-05-14 DIAGNOSIS — G459 Transient cerebral ischemic attack, unspecified: Secondary | ICD-10-CM | POA: Diagnosis not present

## 2019-05-14 DIAGNOSIS — M8580 Other specified disorders of bone density and structure, unspecified site: Secondary | ICD-10-CM | POA: Diagnosis not present

## 2019-05-14 DIAGNOSIS — I10 Essential (primary) hypertension: Secondary | ICD-10-CM | POA: Diagnosis not present

## 2019-05-14 DIAGNOSIS — E039 Hypothyroidism, unspecified: Secondary | ICD-10-CM | POA: Diagnosis not present

## 2019-05-14 DIAGNOSIS — M1990 Unspecified osteoarthritis, unspecified site: Secondary | ICD-10-CM | POA: Diagnosis not present

## 2019-05-14 DIAGNOSIS — R7301 Impaired fasting glucose: Secondary | ICD-10-CM | POA: Diagnosis not present

## 2019-05-14 DIAGNOSIS — C50919 Malignant neoplasm of unspecified site of unspecified female breast: Secondary | ICD-10-CM | POA: Diagnosis not present

## 2019-05-30 DIAGNOSIS — Z23 Encounter for immunization: Secondary | ICD-10-CM | POA: Diagnosis not present

## 2019-06-04 DIAGNOSIS — H01005 Unspecified blepharitis left lower eyelid: Secondary | ICD-10-CM | POA: Diagnosis not present

## 2019-06-04 DIAGNOSIS — H01002 Unspecified blepharitis right lower eyelid: Secondary | ICD-10-CM | POA: Diagnosis not present

## 2019-06-04 DIAGNOSIS — H1859 Other hereditary corneal dystrophies: Secondary | ICD-10-CM | POA: Diagnosis not present

## 2019-06-04 DIAGNOSIS — H52203 Unspecified astigmatism, bilateral: Secondary | ICD-10-CM | POA: Diagnosis not present

## 2019-07-18 ENCOUNTER — Ambulatory Visit (HOSPITAL_COMMUNITY): Payer: Medicare Other

## 2019-08-07 DIAGNOSIS — L661 Lichen planopilaris: Secondary | ICD-10-CM | POA: Diagnosis not present

## 2019-08-09 DIAGNOSIS — H00025 Hordeolum internum left lower eyelid: Secondary | ICD-10-CM | POA: Diagnosis not present

## 2019-08-09 DIAGNOSIS — R1084 Generalized abdominal pain: Secondary | ICD-10-CM | POA: Diagnosis not present

## 2019-08-09 DIAGNOSIS — I10 Essential (primary) hypertension: Secondary | ICD-10-CM | POA: Diagnosis not present

## 2019-08-09 DIAGNOSIS — H00029 Hordeolum internum unspecified eye, unspecified eyelid: Secondary | ICD-10-CM | POA: Insufficient documentation

## 2019-08-12 ENCOUNTER — Encounter (HOSPITAL_COMMUNITY): Payer: Self-pay

## 2019-08-12 ENCOUNTER — Other Ambulatory Visit: Payer: Self-pay

## 2019-08-12 ENCOUNTER — Ambulatory Visit (HOSPITAL_COMMUNITY)
Admission: RE | Admit: 2019-08-12 | Discharge: 2019-08-12 | Disposition: A | Discharge status: Home / Self Care | Payer: Medicare Other | Source: Ambulatory Visit | Attending: Internal Medicine | Admitting: Internal Medicine

## 2019-08-12 DIAGNOSIS — M810 Age-related osteoporosis without current pathological fracture: Secondary | ICD-10-CM | POA: Insufficient documentation

## 2019-08-12 MED ORDER — DENOSUMAB 60 MG/ML ~~LOC~~ SOSY
60.0000 mg | PREFILLED_SYRINGE | Freq: Once | SUBCUTANEOUS | Status: AC
  Administered 2019-08-12: 12:00:00 60 mg via SUBCUTANEOUS
  Filled 2019-08-12: qty 1

## 2019-08-12 NOTE — Discharge Instructions (Signed)
Denosumab injection °What is this medicine? °DENOSUMAB (den oh sue mab) slows bone breakdown. Prolia is used to treat osteoporosis in women after menopause and in men, and in people who are taking corticosteroids for 6 months or more. Xgeva is used to treat a high calcium level due to cancer and to prevent bone fractures and other bone problems caused by multiple myeloma or cancer bone metastases. Xgeva is also used to treat giant cell tumor of the bone. °This medicine may be used for other purposes; ask your health care provider or pharmacist if you have questions. °COMMON BRAND NAME(S): Prolia, XGEVA °What should I tell my health care provider before I take this medicine? °They need to know if you have any of these conditions: °· dental disease °· having surgery or tooth extraction °· infection °· kidney disease °· low levels of calcium or Vitamin D in the blood °· malnutrition °· on hemodialysis °· skin conditions or sensitivity °· thyroid or parathyroid disease °· an unusual reaction to denosumab, other medicines, foods, dyes, or preservatives °· pregnant or trying to get pregnant °· breast-feeding °How should I use this medicine? °This medicine is for injection under the skin. It is given by a health care professional in a hospital or clinic setting. °A special MedGuide will be given to you before each treatment. Be sure to read this information carefully each time. °For Prolia, talk to your pediatrician regarding the use of this medicine in children. Special care may be needed. For Xgeva, talk to your pediatrician regarding the use of this medicine in children. While this drug may be prescribed for children as young as 13 years for selected conditions, precautions do apply. °Overdosage: If you think you have taken too much of this medicine contact a poison control center or emergency room at once. °NOTE: This medicine is only for you. Do not share this medicine with others. °What if I miss a dose? °It is  important not to miss your dose. Call your doctor or health care professional if you are unable to keep an appointment. °What may interact with this medicine? °Do not take this medicine with any of the following medications: °· other medicines containing denosumab °This medicine may also interact with the following medications: °· medicines that lower your chance of fighting infection °· steroid medicines like prednisone or cortisone °This list may not describe all possible interactions. Give your health care provider a list of all the medicines, herbs, non-prescription drugs, or dietary supplements you use. Also tell them if you smoke, drink alcohol, or use illegal drugs. Some items may interact with your medicine. °What should I watch for while using this medicine? °Visit your doctor or health care professional for regular checks on your progress. Your doctor or health care professional may order blood tests and other tests to see how you are doing. °Call your doctor or health care professional for advice if you get a fever, chills or sore throat, or other symptoms of a cold or flu. Do not treat yourself. This drug may decrease your body's ability to fight infection. Try to avoid being around people who are sick. °You should make sure you get enough calcium and vitamin D while you are taking this medicine, unless your doctor tells you not to. Discuss the foods you eat and the vitamins you take with your health care professional. °See your dentist regularly. Brush and floss your teeth as directed. Before you have any dental work done, tell your dentist you are   receiving this medicine. Do not become pregnant while taking this medicine or for 5 months after stopping it. Talk with your doctor or health care professional about your birth control options while taking this medicine. Women should inform their doctor if they wish to become pregnant or think they might be pregnant. There is a potential for serious side  effects to an unborn child. Talk to your health care professional or pharmacist for more information. What side effects may I notice from receiving this medicine? Side effects that you should report to your doctor or health care professional as soon as possible:  allergic reactions like skin rash, itching or hives, swelling of the face, lips, or tongue  bone pain  breathing problems  dizziness  jaw pain, especially after dental work  redness, blistering, peeling of the skin  signs and symptoms of infection like fever or chills; cough; sore throat; pain or trouble passing urine  signs of low calcium like fast heartbeat, muscle cramps or muscle pain; pain, tingling, numbness in the hands or feet; seizures  unusual bleeding or bruising  unusually weak or tired Side effects that usually do not require medical attention (report to your doctor or health care professional if they continue or are bothersome):  constipation  diarrhea  headache  joint pain  loss of appetite  muscle pain  runny nose  tiredness  upset stomach This list may not describe all possible side effects. Call your doctor for medical advice about side effects. You may report side effects to FDA at 1-800-FDA-1088. Where should I keep my medicine? This medicine is only given in a clinic, doctor's office, or other health care setting and will not be stored at home. NOTE: This sheet is a summary. It may not cover all possible information. If you have questions about this medicine, talk to your doctor, pharmacist, or health care provider.  2020 Elsevier/Gold Standard (2018-01-12 16:10:44)

## 2019-11-02 DIAGNOSIS — Z23 Encounter for immunization: Secondary | ICD-10-CM | POA: Diagnosis not present

## 2019-11-06 DIAGNOSIS — L661 Lichen planopilaris: Secondary | ICD-10-CM | POA: Diagnosis not present

## 2019-11-19 ENCOUNTER — Encounter: Payer: Self-pay | Admitting: Sports Medicine

## 2019-11-19 ENCOUNTER — Other Ambulatory Visit: Payer: Self-pay

## 2019-11-19 ENCOUNTER — Ambulatory Visit (INDEPENDENT_AMBULATORY_CARE_PROVIDER_SITE_OTHER): Payer: Medicare Other | Admitting: Sports Medicine

## 2019-11-19 ENCOUNTER — Ambulatory Visit (INDEPENDENT_AMBULATORY_CARE_PROVIDER_SITE_OTHER): Payer: Medicare Other

## 2019-11-19 ENCOUNTER — Other Ambulatory Visit: Payer: Self-pay | Admitting: Sports Medicine

## 2019-11-19 VITALS — Temp 96.8°F

## 2019-11-19 DIAGNOSIS — Q688 Other specified congenital musculoskeletal deformities: Secondary | ICD-10-CM | POA: Diagnosis not present

## 2019-11-19 DIAGNOSIS — M25571 Pain in right ankle and joints of right foot: Secondary | ICD-10-CM

## 2019-11-19 DIAGNOSIS — M79671 Pain in right foot: Secondary | ICD-10-CM

## 2019-11-19 DIAGNOSIS — M7661 Achilles tendinitis, right leg: Secondary | ICD-10-CM | POA: Diagnosis not present

## 2019-11-19 DIAGNOSIS — M1990 Unspecified osteoarthritis, unspecified site: Secondary | ICD-10-CM

## 2019-11-19 DIAGNOSIS — S99921A Unspecified injury of right foot, initial encounter: Secondary | ICD-10-CM | POA: Diagnosis not present

## 2019-11-19 DIAGNOSIS — M7741 Metatarsalgia, right foot: Secondary | ICD-10-CM

## 2019-11-19 NOTE — Progress Notes (Signed)
Subjective: Tanya Morales is a 86 y.o. female patient who presents to office for evaluation of Right heel pain that radiates to the ball of the foot and her toes especially with flexion and extension of the toes feels the pain at the back of the heel patient reports that she has had pain for about the last 3 weeks in the back of her heel but then took a misstep when she was stepping into her garage and felt an excruciating pain going up the back of the heel as she was trying to take out the trash.  Patient reports since that misstep pain has worsened and reports that her initial injury 3 weeks ago happened when she was stretching trying to get something out of the closet and had pain however now her symptoms has worsened since Sunday after that misstep.  Patient reports that she has been trying to rest more which seems to help her pain and does not have any pain when she is sleeping however pain is worse with walking and standing.  Patient has not tried any specific treatment. Patient denies any other pedal complaints.   Review of Systems  All other systems reviewed and are negative.    Patient Active Problem List   Diagnosis Date Noted  . Status post left hip replacement 11/04/2016  . Abnormal nuclear stress test   . Arthritis of midfoot 05/24/2016  . Osteoarthritis of left hip 04/21/2016  . Neck pain 11/04/2014  . Midline low back pain without sciatica 11/04/2014  . Breast cancer, stage 1 ([place]) 03/14/2011  . Postop check 03/14/2011    Current Outpatient Medications on File Prior to Visit  Medication Sig Dispense Refill  . amLODipine (NORVASC) 5 MG tablet Take 5 mg by mouth every morning.     Marland Kitchen aspirin 325 MG tablet Take 81 mg by mouth daily.     . Calcium-Magnesium (CAL-MAG) 500-250 MG TABS Take 1 tablet by mouth daily.     . cholecalciferol (VITAMIN D) 400 UNITS TABS tablet Take 400 Units by mouth daily.     Marland Kitchen co-enzyme Q-10 30 MG capsule Take 30 mg by mouth daily.    Marland Kitchen  levothyroxine (SYNTHROID, LEVOTHROID) 50 MCG tablet Take 50 mcg by mouth daily before breakfast.     . methocarbamol (ROBAXIN) 500 MG tablet Take 1 tablet (500 mg total) by mouth every 6 (six) hours as needed for muscle spasms. 60 tablet 1  . Multiple Vitamin (MULTIVITAMIN WITH MINERALS) TABS tablet Take 1 tablet by mouth daily.    Marland Kitchen olmesartan (BENICAR) 20 MG tablet Take 20 mg by mouth every morning.     . Probiotic Product (PROBIOTIC PO) Take 1 capsule by mouth daily.    . simvastatin (ZOCOR) 10 MG tablet Take 10 mg by mouth at bedtime.     . vitamin C (ASCORBIC ACID) 500 MG tablet Take 500 mg by mouth daily.       No current facility-administered medications on file prior to visit.    Allergies  Allergen Reactions  . Celebrex [Celecoxib]     COULD NOT BREATHE  . Epinephrine     COULD NOT STAND, FELT BAD    Objective:  General: Alert and oriented x3 in no acute distress  Dermatology: No open lesions bilateral lower extremities, no webspace macerations, no ecchymosis bilateral, all nails x 10 are well manicured.  Vascular: Dorsalis Pedis and Posterior Tibial pedal pulses 1/4, Capillary Fill Time 3 seconds, + pedal hair growth bilateral, varicosities bilateral,  there is trace edema noted right greater than left foot and ankle, temperature gradient within normal limits.  Neurology: Johney Maine sensation intact via light touch bilateral.   Musculoskeletal: Mild tenderness with palpation at insertion of the Achilles on Right that is worse with plantarflexion and extension of toes, there is calcaneal exostosis with mild soft tissue present and decreased ankle rom with knee extending  vs flexed resembling gastroc equnius bilateral, The achilles tendon feels intact with no nodularity or palpable dell, Thompson sign negative, Subtalar and midtarsal joint range of motion is within normal limits, there is no 1st ray hypermobility or symptomatic forefoot deformity noted bilateral besides deviation of the  lesser toes.   Gait: Antalgic gait  Xrays  Right Foot    Impression: Normal osseous mineralization. Joint spaces preserved except at midfoot and ankle. No fracture/dislocation/boney destruction. Calcaneal spur present. Kager's triangle intact with no obliteration. No soft tissue abnormalities or radiopaque foreign bodies.   Assessment and Plan: Problem List Items Addressed This Visit    None    Visit Diagnoses    Arthritis    -  Primary   Achilles tendinitis, right leg       Metatarsalgia of right foot       Os trigonum syndrome       Foot pain, right       Acute right ankle pain          -Complete examination performed -Xrays reviewed -Discussed treatement options -Dispensed short CAM boot for patient to use at all times when attempting to ambulate and advised patient that she cannot drive with this boot -Dispensed surgitube compression sleeve for patient to use for edema control as instructed to take off for showers and at bedtime -Recommend patient to take Tylenol for the pain and ice in the evening for the inflammation -No improvement will consider [place] or MRI to rule out any type of tear since patient had an increased episode of pain after taking a misstep on Sunday -Patient to return to office as needed or sooner if condition worsens.  Advised the patient to call office if symptoms are not better in 2 weeks for me to proceed with ordering ultrasound or MRI.  Landis Martins, DPM

## 2019-11-19 NOTE — Patient Instructions (Signed)
Use cam boot when you are attempting to walk or stand to help with her foot pain  Use compression sleeve to help with swelling  Take Tylenol and ice for 15 mins to the heel for pain

## 2019-11-30 DIAGNOSIS — Z23 Encounter for immunization: Secondary | ICD-10-CM | POA: Diagnosis not present

## 2019-12-03 ENCOUNTER — Telehealth: Payer: Self-pay | Admitting: Sports Medicine

## 2019-12-03 NOTE — Telephone Encounter (Signed)
Pt called and said foot is doing better and she is not wearing the boot. She stated that the calluses are hurting her.

## 2019-12-03 NOTE — Telephone Encounter (Signed)
Glad her foot is doing better, for her calluses she will need to come to office for an appointment or try a skin softening cream like okeeffe healthy feet until she can be seen -Dr. Cannon Kettle

## 2019-12-06 DIAGNOSIS — M859 Disorder of bone density and structure, unspecified: Secondary | ICD-10-CM | POA: Diagnosis not present

## 2019-12-06 DIAGNOSIS — E7849 Other hyperlipidemia: Secondary | ICD-10-CM | POA: Diagnosis not present

## 2019-12-06 DIAGNOSIS — E038 Other specified hypothyroidism: Secondary | ICD-10-CM | POA: Diagnosis not present

## 2019-12-09 DIAGNOSIS — Z780 Asymptomatic menopausal state: Secondary | ICD-10-CM | POA: Diagnosis not present

## 2019-12-13 DIAGNOSIS — R809 Proteinuria, unspecified: Secondary | ICD-10-CM | POA: Diagnosis not present

## 2019-12-13 DIAGNOSIS — R05 Cough: Secondary | ICD-10-CM | POA: Diagnosis not present

## 2019-12-13 DIAGNOSIS — F419 Anxiety disorder, unspecified: Secondary | ICD-10-CM | POA: Diagnosis not present

## 2019-12-13 DIAGNOSIS — Z853 Personal history of malignant neoplasm of breast: Secondary | ICD-10-CM | POA: Insufficient documentation

## 2019-12-13 DIAGNOSIS — M79672 Pain in left foot: Secondary | ICD-10-CM | POA: Diagnosis not present

## 2019-12-13 DIAGNOSIS — G459 Transient cerebral ischemic attack, unspecified: Secondary | ICD-10-CM | POA: Diagnosis not present

## 2019-12-13 DIAGNOSIS — D539 Nutritional anemia, unspecified: Secondary | ICD-10-CM | POA: Diagnosis not present

## 2019-12-13 DIAGNOSIS — Z1339 Encounter for screening examination for other mental health and behavioral disorders: Secondary | ICD-10-CM | POA: Diagnosis not present

## 2019-12-13 DIAGNOSIS — R82998 Other abnormal findings in urine: Secondary | ICD-10-CM | POA: Diagnosis not present

## 2019-12-13 DIAGNOSIS — Z0000 Encounter for general adult medical examination without abnormal findings: Secondary | ICD-10-CM | POA: Diagnosis not present

## 2019-12-13 DIAGNOSIS — M1990 Unspecified osteoarthritis, unspecified site: Secondary | ICD-10-CM | POA: Diagnosis not present

## 2019-12-13 DIAGNOSIS — Z1331 Encounter for screening for depression: Secondary | ICD-10-CM | POA: Diagnosis not present

## 2019-12-13 DIAGNOSIS — R634 Abnormal weight loss: Secondary | ICD-10-CM | POA: Diagnosis not present

## 2019-12-13 DIAGNOSIS — R7301 Impaired fasting glucose: Secondary | ICD-10-CM | POA: Diagnosis not present

## 2019-12-13 DIAGNOSIS — R600 Localized edema: Secondary | ICD-10-CM | POA: Diagnosis not present

## 2019-12-26 DIAGNOSIS — H00012 Hordeolum externum right lower eyelid: Secondary | ICD-10-CM | POA: Diagnosis not present

## 2020-02-05 DIAGNOSIS — L661 Lichen planopilaris: Secondary | ICD-10-CM | POA: Diagnosis not present

## 2020-02-12 ENCOUNTER — Encounter (HOSPITAL_COMMUNITY): Payer: Self-pay

## 2020-02-12 ENCOUNTER — Ambulatory Visit (HOSPITAL_COMMUNITY)
Admission: RE | Admit: 2020-02-12 | Discharge: 2020-02-12 | Disposition: A | Discharge status: Home / Self Care | Payer: Medicare Other | Source: Ambulatory Visit | Attending: Internal Medicine | Admitting: Internal Medicine

## 2020-02-12 ENCOUNTER — Encounter (HOSPITAL_COMMUNITY): Payer: Medicare Other

## 2020-02-12 ENCOUNTER — Other Ambulatory Visit: Payer: Self-pay

## 2020-02-12 DIAGNOSIS — M810 Age-related osteoporosis without current pathological fracture: Secondary | ICD-10-CM | POA: Diagnosis not present

## 2020-02-12 MED ORDER — DENOSUMAB 60 MG/ML ~~LOC~~ SOSY
60.0000 mg | PREFILLED_SYRINGE | Freq: Once | SUBCUTANEOUS | Status: AC
  Administered 2020-02-12: 60 mg via SUBCUTANEOUS
  Filled 2020-02-12: qty 1

## 2020-02-12 NOTE — Discharge Instructions (Signed)
Denosumab injection What is this medicine? DENOSUMAB (den oh sue mab) slows bone breakdown. Prolia is used to treat osteoporosis in women after menopause and in men, and in people who are taking corticosteroids for 6 months or more. Xgeva is used to treat a high calcium level due to cancer and to prevent bone fractures and other bone problems caused by multiple myeloma or cancer bone metastases. Xgeva is also used to treat giant cell tumor of the bone. This medicine may be used for other purposes; ask your health care provider or pharmacist if you have questions. COMMON BRAND NAME(S): Prolia, XGEVA What should I tell my health care provider before I take this medicine? They need to know if you have any of these conditions:  dental disease  having surgery or tooth extraction  infection  kidney disease  low levels of calcium or Vitamin D in the blood  malnutrition  on hemodialysis  skin conditions or sensitivity  thyroid or parathyroid disease  an unusual reaction to denosumab, other medicines, foods, dyes, or preservatives  pregnant or trying to get pregnant  breast-feeding How should I use this medicine? This medicine is for injection under the skin. It is given by a health care professional in a hospital or clinic setting. A special MedGuide will be given to you before each treatment. Be sure to read this information carefully each time. For Prolia, talk to your pediatrician regarding the use of this medicine in children. Special care may be needed. For Xgeva, talk to your pediatrician regarding the use of this medicine in children. While this drug may be prescribed for children as young as 13 years for selected conditions, precautions do apply. Overdosage: If you think you have taken too much of this medicine contact a poison control center or emergency room at once. NOTE: This medicine is only for you. Do not share this medicine with others. What if I miss a dose? It is  important not to miss your dose. Call your doctor or health care professional if you are unable to keep an appointment. What may interact with this medicine? Do not take this medicine with any of the following medications:  other medicines containing denosumab This medicine may also interact with the following medications:  medicines that lower your chance of fighting infection  steroid medicines like prednisone or cortisone This list may not describe all possible interactions. Give your health care provider a list of all the medicines, herbs, non-prescription drugs, or dietary supplements you use. Also tell them if you smoke, drink alcohol, or use illegal drugs. Some items may interact with your medicine. What should I watch for while using this medicine? Visit your doctor or health care professional for regular checks on your progress. Your doctor or health care professional may order blood tests and other tests to see how you are doing. Call your doctor or health care professional for advice if you get a fever, chills or sore throat, or other symptoms of a cold or flu. Do not treat yourself. This drug may decrease your body's ability to fight infection. Try to avoid being around people who are sick. You should make sure you get enough calcium and vitamin D while you are taking this medicine, unless your doctor tells you not to. Discuss the foods you eat and the vitamins you take with your health care professional. See your dentist regularly. Brush and floss your teeth as directed. Before you have any dental work done, tell your dentist you are   receiving this medicine. Do not become pregnant while taking this medicine or for 5 months after stopping it. Talk with your doctor or health care professional about your birth control options while taking this medicine. Women should inform their doctor if they wish to become pregnant or think they might be pregnant. There is a potential for serious side  effects to an unborn child. Talk to your health care professional or pharmacist for more information. What side effects may I notice from receiving this medicine? Side effects that you should report to your doctor or health care professional as soon as possible:  allergic reactions like skin rash, itching or hives, swelling of the face, lips, or tongue  bone pain  breathing problems  dizziness  jaw pain, especially after dental work  redness, blistering, peeling of the skin  signs and symptoms of infection like fever or chills; cough; sore throat; pain or trouble passing urine  signs of low calcium like fast heartbeat, muscle cramps or muscle pain; pain, tingling, numbness in the hands or feet; seizures  unusual bleeding or bruising  unusually weak or tired Side effects that usually do not require medical attention (report to your doctor or health care professional if they continue or are bothersome):  constipation  diarrhea  headache  joint pain  loss of appetite  muscle pain  runny nose  tiredness  upset stomach This list may not describe all possible side effects. Call your doctor for medical advice about side effects. You may report side effects to FDA at 1-800-FDA-1088. Where should I keep my medicine? This medicine is only given in a clinic, doctor's office, or other health care setting and will not be stored at home. NOTE: This sheet is a summary. It may not cover all possible information. If you have questions about this medicine, talk to your doctor, pharmacist, or health care provider.  2020 Elsevier/Gold Standard (2018-01-12 16:10:44)  

## 2020-02-28 ENCOUNTER — Encounter (HOSPITAL_COMMUNITY): Payer: Medicare Other

## 2020-04-08 DIAGNOSIS — L661 Lichen planopilaris: Secondary | ICD-10-CM | POA: Diagnosis not present

## 2020-05-13 ENCOUNTER — Other Ambulatory Visit: Payer: Self-pay

## 2020-05-13 ENCOUNTER — Encounter: Payer: Self-pay | Admitting: Podiatry

## 2020-05-13 ENCOUNTER — Ambulatory Visit (INDEPENDENT_AMBULATORY_CARE_PROVIDER_SITE_OTHER): Payer: Medicare Other | Admitting: Podiatry

## 2020-05-13 DIAGNOSIS — S92515S Nondisplaced fracture of proximal phalanx of left lesser toe(s), sequela: Secondary | ICD-10-CM | POA: Diagnosis not present

## 2020-05-13 DIAGNOSIS — S92912A Unspecified fracture of left toe(s), initial encounter for closed fracture: Secondary | ICD-10-CM | POA: Insufficient documentation

## 2020-05-13 NOTE — Progress Notes (Signed)
This patient presents the office with chief complaint of painful callus under her third toe left foot.  She says these calluses have been developing over time since her surgery.  This patient was a poor historian.  She says that she is having significant pain and discomfort as she walks in the third toe of her left foot.  She does have 2 porokeratotic lesions noted under her third toe left foot.  She previously was treated in this office by Dr. Cannon Kettle who dispensed her a cam walker.  She says she took the walker home and never used the cam walker.  She presents the office today with a caregiver for an evaluation of her left foot.  Vascular  Dorsalis pedis and posterior tibial pulses are palpable  B/L.  Capillary return  WNL.  Temperature gradient is  WNL.  Skin turgor  WNL  Sensorium  Senn Weinstein monofilament wire  WNL. Normal tactile sensation.  Nail Exam  Patient has normal nails with no evidence of bacterial or fungal infection.  Orthopedic  Exam  Muscle tone and muscle strength  WNL.  No limitations of motion feet  B/L.  No crepitus or joint effusion noted.  Foot type is unremarkable and digits show no abnormalities.  Bony prominences are unremarkable. Painful PIPJ from medial-lateral pressure.  Thickened PIPJ 3rd toe left.  No motion PIPJ 3rd toe left foot.   Skin  No open lesions.  Normal skin texture and turgor.Porokeratotic lesions under plantar condyle proximal phalanx 3rd toe left.  Callus 3rd toe left foot.  Probable intra-articular fracture PIPJ 3rd toe left foot.   ROV.  Debride callus with # 15 blade.  This patient responded from medial and lateral pressure of the third toe left foot.  She also lacked any motion through joint of the third toe left foot.  I discussed this condition with this patient.  Told this patient that there is a probable intra-articular fracture at the PIPJ third toe left foot.  The palpation of the PIPJ was the site of severe pain and discomfort.  The third toe  was in a dorsiflexed position at the level of the intra-articular fracture placing pressure on the condyles of the proximal phalanx at the level of the PIPJ.  Patient was treated with a gel toe cap and buddy taping the second and third toes together.  Upon leaving the room patient states that she was having no pain and discomfort desired to return to the office in 3 weeks for further evaluation.  During the visit she said she was not interested in injection therapy or xrays.  As she was leaving she asked if the x-ray could be taken.  I told her I would take that x-ray at her next visit.  She then said she would like to have her fractured toe evaluated by one of the other doctors in the practice.  She then said that she would be interested in obtaining a surgical shoe since the we discussed dispensing the shoe during her visit.  Ammie  proceeded to get her shoe and she said that the shoe aggravated her toe.  So the surgical shoe was returned  for storage.  Patient had also requested that she present to the office daily to have the toe padded and buddy taped which I told her was not able to be done.  Return to the clinic in 3 weeks as needed.   Gardiner Barefoot DPM

## 2020-06-09 DIAGNOSIS — Z961 Presence of intraocular lens: Secondary | ICD-10-CM | POA: Diagnosis not present

## 2020-06-09 DIAGNOSIS — H01002 Unspecified blepharitis right lower eyelid: Secondary | ICD-10-CM | POA: Diagnosis not present

## 2020-06-09 DIAGNOSIS — H01005 Unspecified blepharitis left lower eyelid: Secondary | ICD-10-CM | POA: Diagnosis not present

## 2020-06-09 DIAGNOSIS — H5203 Hypermetropia, bilateral: Secondary | ICD-10-CM | POA: Diagnosis not present

## 2020-06-10 DIAGNOSIS — G459 Transient cerebral ischemic attack, unspecified: Secondary | ICD-10-CM | POA: Diagnosis not present

## 2020-06-10 DIAGNOSIS — M810 Age-related osteoporosis without current pathological fracture: Secondary | ICD-10-CM | POA: Diagnosis not present

## 2020-06-10 DIAGNOSIS — E039 Hypothyroidism, unspecified: Secondary | ICD-10-CM | POA: Diagnosis not present

## 2020-06-10 DIAGNOSIS — M545 Low back pain: Secondary | ICD-10-CM | POA: Diagnosis not present

## 2020-06-10 DIAGNOSIS — I10 Essential (primary) hypertension: Secondary | ICD-10-CM | POA: Diagnosis not present

## 2020-06-10 DIAGNOSIS — Z853 Personal history of malignant neoplasm of breast: Secondary | ICD-10-CM | POA: Diagnosis not present

## 2020-06-10 DIAGNOSIS — R7301 Impaired fasting glucose: Secondary | ICD-10-CM | POA: Diagnosis not present

## 2020-06-17 DIAGNOSIS — I8311 Varicose veins of right lower extremity with inflammation: Secondary | ICD-10-CM | POA: Diagnosis not present

## 2020-06-17 DIAGNOSIS — D1801 Hemangioma of skin and subcutaneous tissue: Secondary | ICD-10-CM | POA: Diagnosis not present

## 2020-06-17 DIAGNOSIS — I8312 Varicose veins of left lower extremity with inflammation: Secondary | ICD-10-CM | POA: Diagnosis not present

## 2020-06-17 DIAGNOSIS — I872 Venous insufficiency (chronic) (peripheral): Secondary | ICD-10-CM | POA: Diagnosis not present

## 2020-06-17 DIAGNOSIS — L814 Other melanin hyperpigmentation: Secondary | ICD-10-CM | POA: Diagnosis not present

## 2020-06-17 DIAGNOSIS — L570 Actinic keratosis: Secondary | ICD-10-CM | POA: Diagnosis not present

## 2020-06-17 DIAGNOSIS — L661 Lichen planopilaris: Secondary | ICD-10-CM | POA: Diagnosis not present

## 2020-06-17 DIAGNOSIS — L821 Other seborrheic keratosis: Secondary | ICD-10-CM | POA: Diagnosis not present

## 2020-06-18 ENCOUNTER — Other Ambulatory Visit: Payer: Self-pay

## 2020-06-18 ENCOUNTER — Ambulatory Visit (INDEPENDENT_AMBULATORY_CARE_PROVIDER_SITE_OTHER): Payer: Medicare Other | Admitting: Podiatry

## 2020-06-18 ENCOUNTER — Other Ambulatory Visit: Payer: Self-pay | Admitting: Podiatry

## 2020-06-18 ENCOUNTER — Ambulatory Visit (INDEPENDENT_AMBULATORY_CARE_PROVIDER_SITE_OTHER): Payer: Medicare Other

## 2020-06-18 DIAGNOSIS — S92515S Nondisplaced fracture of proximal phalanx of left lesser toe(s), sequela: Secondary | ICD-10-CM | POA: Diagnosis not present

## 2020-06-18 DIAGNOSIS — M19072 Primary osteoarthritis, left ankle and foot: Secondary | ICD-10-CM

## 2020-06-18 DIAGNOSIS — M2042 Other hammer toe(s) (acquired), left foot: Secondary | ICD-10-CM

## 2020-06-18 DIAGNOSIS — M778 Other enthesopathies, not elsewhere classified: Secondary | ICD-10-CM

## 2020-06-18 DIAGNOSIS — L84 Corns and callosities: Secondary | ICD-10-CM

## 2020-06-18 NOTE — Patient Instructions (Signed)
Look for urea 40% cream or ointment and apply to the thickened dry skin / calluses. This can be bought over the counter, at a pharmacy or online such as [employer].  

## 2020-06-18 NOTE — Progress Notes (Signed)
  Subjective:  Patient ID: Tanya Morales, female    DOB: 1929-07-09,  MRN: [id number]  Chief Complaint  Patient presents with  . Toe Pain    LFT TOES 2ND 3RD TOE pain    86 y.o. female presents with the above complaint. History confirmed with patient.  Referred to me by Dr. Prudence Davidson.  She states that he thought she had a fracture and sent her to me for an x-ray.  She also painful callus on the same left third toe  Objective:  Physical Exam: warm, good capillary refill, no trophic changes or ulcerative lesions, normal DP and PT pulses and normal sensory exam. Left Foot: Third toe severe calluses plantar proximal phalanx, hammertoes rigid Right Foot: Heloma molle fourth interspace right foot on the lateral fourth toe  Radiographs: X-ray of the left foot: Severe osteoarthritis of the PIPJ and DIPJ of the left third toe.  No visible fracture on x-ray Assessment:   1. Closed nondisplaced fracture of proximal phalanx of lesser toe of left foot, sequela   2. Hammertoe of left foot   3. Osteoarthritis of toe joint, left   4. Callus of foot      Plan:  Patient was evaluated and treated and all questions answered.   All symptomatic hyperkeratoses were safely debrided with a sterile #15 blade to patient's level of comfort without incident. We discussed preventative and palliative care of these lesions including supportive and accommodative shoegear, padding, prefabricated and custom molded accommodative orthoses, use of a pumice stone and lotions/creams daily.  Recommended she use silicone toe crest for the home only on the right foot.  She is able to the left foot due to inability to get down to this foot.  Recommend she use urea cream to keep all hyperkeratoses soft  Continue WBAT in regular shoe gear, if there was a fracture is healed now.  Return in about 1 month (around 07/18/2020).

## 2020-06-30 ENCOUNTER — Other Ambulatory Visit: Payer: Self-pay

## 2020-06-30 ENCOUNTER — Ambulatory Visit (INDEPENDENT_AMBULATORY_CARE_PROVIDER_SITE_OTHER): Payer: Medicare Other | Admitting: Podiatry

## 2020-06-30 DIAGNOSIS — M19072 Primary osteoarthritis, left ankle and foot: Secondary | ICD-10-CM | POA: Diagnosis not present

## 2020-06-30 DIAGNOSIS — L84 Corns and callosities: Secondary | ICD-10-CM | POA: Diagnosis not present

## 2020-06-30 DIAGNOSIS — M2042 Other hammer toe(s) (acquired), left foot: Secondary | ICD-10-CM | POA: Diagnosis not present

## 2020-06-30 DIAGNOSIS — S92515S Nondisplaced fracture of proximal phalanx of left lesser toe(s), sequela: Secondary | ICD-10-CM | POA: Diagnosis not present

## 2020-07-01 NOTE — Progress Notes (Signed)
  Subjective:  Patient ID: Tanya Morales, female    DOB: Apr 19, 1929,  MRN: [id number]  No chief complaint on file.   86 y.o. female returns with the above complaint. History confirmed with patient.  Doing quite well, the callus region and the silicone caps have been very helpful, although she has quite difficulty putting them on.  Objective:  Physical Exam: warm, good capillary refill, no trophic changes or ulcerative lesions, normal DP and PT pulses and normal sensory exam. Left Foot: Third toe mild calluses plantar proximal phalanx, hammertoes rigid Right Foot: Heloma molle fourth interspace right foot on the lateral fourth toe  Radiographs: X-ray of the left foot: Severe osteoarthritis of the PIPJ and DIPJ of the left third toe.  No visible fracture on x-ray Assessment:   1. Closed nondisplaced fracture of proximal phalanx of lesser toe of left foot, sequela   2. Hammertoe of left foot   3. Osteoarthritis of toe joint, left   4. Callus of foot      Plan:  Patient was evaluated and treated and all questions answered.   Doing well, continue current treatment plan with intermittent debridements and offloading whenever possible.  She will follow-up with Dr. Prudence Davidson next month  No follow-ups on file.

## 2020-07-13 ENCOUNTER — Ambulatory Visit: Payer: Medicare Other | Admitting: Podiatry

## 2020-07-24 DIAGNOSIS — H0100A Unspecified blepharitis right eye, upper and lower eyelids: Secondary | ICD-10-CM | POA: Diagnosis not present

## 2020-07-24 DIAGNOSIS — H02052 Trichiasis without entropian right lower eyelid: Secondary | ICD-10-CM | POA: Diagnosis not present

## 2020-07-24 DIAGNOSIS — H02054 Trichiasis without entropian left upper eyelid: Secondary | ICD-10-CM | POA: Diagnosis not present

## 2020-07-24 DIAGNOSIS — H0100B Unspecified blepharitis left eye, upper and lower eyelids: Secondary | ICD-10-CM | POA: Diagnosis not present

## 2020-07-27 DIAGNOSIS — Z23 Encounter for immunization: Secondary | ICD-10-CM | POA: Diagnosis not present

## 2020-08-05 ENCOUNTER — Encounter: Payer: Self-pay | Admitting: Podiatry

## 2020-08-05 ENCOUNTER — Other Ambulatory Visit: Payer: Self-pay

## 2020-08-05 ENCOUNTER — Ambulatory Visit (INDEPENDENT_AMBULATORY_CARE_PROVIDER_SITE_OTHER): Payer: Medicare Other | Admitting: Podiatry

## 2020-08-05 DIAGNOSIS — L84 Corns and callosities: Secondary | ICD-10-CM

## 2020-08-05 DIAGNOSIS — M79671 Pain in right foot: Secondary | ICD-10-CM

## 2020-08-05 NOTE — Progress Notes (Signed)
This patient returns to my office for at risk foot care.  This patient requires this care by a professional since this patient will be at risk due to having midfoot arthritis and contracted toes  both feet. This patient is unable to trim the callus under third toe left foot herself since the patient cannot reach her feet.  .The third toe left foot is  painful walking and wearing shoes.  This patient presents for at risk foot care today.  She also says the toes 2-5 right foot are swollen and deviated.    Her second toe left foot has healed from her broken toe left foot.  General Appearance  Alert, conversant and in no acute stress.  Vascular  Dorsalis pedis and posterior tibial  pulses are palpable  bilaterally.  Capillary return is within normal limits  bilaterally. Temperature is within normal limits  bilaterally.  Neurologic  Senn-Weinstein monofilament wire test within normal limits  bilaterally. Muscle power within normal limits bilaterally.  Nails Thick disfigured discolored nails with subungual debris  from hallux to fifth toes bilaterally. No evidence of bacterial infection or drainage bilaterally.  Orthopedic  No limitations of motion  feet .  No crepitus or effusions noted. Third toe left foot has led to callus formation plantarly.  Her toes right foot are swollen and deviated causing pain.  Pain palpated 2nd MPJ right foot.  Skin  normotropic skin with no porokeratosis noted bilaterally.  No signs of infections or ulcers noted.   Corn plantar aspect PIPJ 3rd toe left foot.  Consent was obtained for treatment procedures.     Debridement of corn third toe left foot.  Told her to make an appointment for an evaluation with Dr.  Britt Bottom.     Return office visit   3 months for corn debridement.                  Told patient to return for periodic foot care and evaluation due to potential at risk complications.   Gardiner Barefoot DPM

## 2020-08-18 ENCOUNTER — Other Ambulatory Visit (HOSPITAL_COMMUNITY): Payer: Self-pay | Admitting: *Deleted

## 2020-08-18 ENCOUNTER — Encounter (HOSPITAL_COMMUNITY): Payer: Medicare Other

## 2020-08-19 ENCOUNTER — Encounter (HOSPITAL_COMMUNITY): Payer: Medicare Other

## 2020-08-19 ENCOUNTER — Ambulatory Visit (HOSPITAL_COMMUNITY)
Admission: RE | Admit: 2020-08-19 | Discharge: 2020-08-19 | Disposition: A | Discharge status: Home / Self Care | Payer: Medicare Other | Source: Ambulatory Visit | Attending: Internal Medicine | Admitting: Internal Medicine

## 2020-08-19 ENCOUNTER — Other Ambulatory Visit: Payer: Self-pay

## 2020-08-19 DIAGNOSIS — M810 Age-related osteoporosis without current pathological fracture: Secondary | ICD-10-CM | POA: Diagnosis not present

## 2020-08-19 MED ORDER — DENOSUMAB 60 MG/ML ~~LOC~~ SOSY
PREFILLED_SYRINGE | SUBCUTANEOUS | Status: AC
  Administered 2020-08-19: 60 mg via SUBCUTANEOUS
  Filled 2020-08-19: qty 1

## 2020-08-19 MED ORDER — DENOSUMAB 60 MG/ML ~~LOC~~ SOSY: 60.0000 mg | PREFILLED_SYRINGE | Freq: Once | SUBCUTANEOUS | Status: AC

## 2020-08-24 ENCOUNTER — Other Ambulatory Visit: Payer: Self-pay

## 2020-08-24 ENCOUNTER — Encounter: Payer: Self-pay | Admitting: Podiatry

## 2020-08-24 ENCOUNTER — Ambulatory Visit (INDEPENDENT_AMBULATORY_CARE_PROVIDER_SITE_OTHER): Payer: Medicare Other | Admitting: Podiatry

## 2020-08-24 ENCOUNTER — Ambulatory Visit (INDEPENDENT_AMBULATORY_CARE_PROVIDER_SITE_OTHER): Payer: Medicare Other

## 2020-08-24 DIAGNOSIS — L84 Corns and callosities: Secondary | ICD-10-CM | POA: Diagnosis not present

## 2020-08-24 DIAGNOSIS — Z23 Encounter for immunization: Secondary | ICD-10-CM | POA: Diagnosis not present

## 2020-08-24 DIAGNOSIS — M19072 Primary osteoarthritis, left ankle and foot: Secondary | ICD-10-CM | POA: Diagnosis not present

## 2020-08-24 DIAGNOSIS — S93491A Sprain of other ligament of right ankle, initial encounter: Secondary | ICD-10-CM | POA: Diagnosis not present

## 2020-08-24 DIAGNOSIS — S93401A Sprain of unspecified ligament of right ankle, initial encounter: Secondary | ICD-10-CM | POA: Diagnosis not present

## 2020-08-24 DIAGNOSIS — S92515S Nondisplaced fracture of proximal phalanx of left lesser toe(s), sequela: Secondary | ICD-10-CM

## 2020-08-24 NOTE — Patient Instructions (Addendum)
Wear the brace while walking  Begin these exercises in 2 weeks:    Ankle Sprain   An ankle sprain is a stretch or tear in one of the tough tissues (ligaments) that connect the bones in your ankle. An ankle sprain can happen when the ankle rolls outward (inversion sprain) or inward (eversion sprain). What are the causes? This condition is caused by rolling or twisting the ankle. What increases the risk? You are more likely to develop this condition if you play sports. What are the signs or symptoms? Symptoms of this condition include:  Pain in your ankle.  Swelling.  Bruising. This may happen right after you sprain your ankle or 1-2 days later.  Trouble standing or walking. How is this diagnosed? This condition is diagnosed with:  A physical exam. During the exam, your doctor will press on certain parts of your foot and ankle and try to move them in certain ways.  X-ray imaging. These may be taken to see how bad the sprain is and to check for broken bones. How is this treated? This condition may be treated with:  A brace or splint. This is used to keep the ankle from moving until it heals.  An elastic bandage. This is used to support the ankle.  Crutches.  Pain medicine.  Surgery. This may be needed if the sprain is very bad.  Physical therapy. This may help to improve movement in the ankle. Follow these instructions at home: If you have a brace or a splint: 1. Wear the brace or splint as told by your doctor. Remove it only as told by your doctor. 2. Loosen the brace or splint if your toes: ? Tingle. ? Lose feeling (become numb). ? Turn cold and blue. 3. Keep the brace or splint clean. 4. If the brace or splint is not waterproof: ? Do not let it get wet. ? Cover it with a watertight covering when you take a bath or a shower. If you have an elastic bandage (dressing): 1. Remove it to shower or bathe. 2. Try not to move your ankle much, but wiggle your toes from  time to time. This helps to prevent swelling. 3. Adjust the dressing if it feels too tight. 4. Loosen the dressing if your foot: ? Loses feeling. ? Tingles. ? Becomes cold and blue. Managing pain, stiffness, and swelling   1. Take over-the-counter and prescription medicines only as told by doctor. 2. For 2-3 days, keep your ankle raised (elevated) above the level of your heart. 3. If told, put ice on the injured area: ? If you have a removable brace or splint, remove it as told by your doctor. ? Put ice in a plastic bag. ? Place a towel between your skin and the bag. ? Leave the ice on for 20 minutes, 2-3 times a day. General instructions  Rest your ankle.  Do not use your injured leg to support your body weight until your doctor says that you can. Use crutches as told by your doctor.  Do not use any products that contain nicotine or tobacco, such as cigarettes, e-cigarettes, and chewing tobacco. If you need help quitting, ask your doctor.  Keep all follow-up visits as told by your doctor. Contact a doctor if:  Your bruises or swelling are quickly getting worse.  Your pain does not get better after you take medicine. Get help right away if:  You cannot feel your toes or foot.  Your foot or toes look blue.  You have very bad pain that gets worse. Summary  An ankle sprain is a stretch or tear in one of the tough tissues (ligaments) that connect the bones in your ankle.  This condition is caused by rolling or twisting the ankle.  Symptoms include pain, swelling, bruising, and trouble walking.  To help with pain and swelling, put ice on the injured ankle, raise your ankle above the level of your heart, and use an elastic bandage. Also, rest as told by your doctor.  Keep all follow-up visits as told by your doctor. This is important. This information is not intended to replace advice given to you by your health care provider. Make sure you discuss any questions you have  with your health care provider. Document Revised: 01/30/2018 Document Reviewed: 01/30/2018 Elsevier Patient Education  [place].  Ankle Sprain, Phase I Rehab An ankle sprain is an injury to the ligaments of your ankle. Ankle sprains cause stiffness, loss of motion, and loss of strength. Ask your health care provider which exercises are safe for you. Do exercises exactly as told by your health care provider and adjust them as directed. It is normal to feel mild stretching, pulling, tightness, or discomfort as you do these exercises. Stop right away if you feel sudden pain or your pain gets worse. Do not begin these exercises until told by your health care provider. Stretching and range-of-motion exercises These exercises warm up your muscles and joints and improve the movement and flexibility of your lower leg and ankle. These exercises also help to relieve pain and stiffness. Gastroc and soleus stretch  This exercise is also called a calf stretch. It stretches the muscles in the back of the lower leg. These muscles are the gastrocnemius, or gastroc, and the soleus. 1. Sit on the floor with your left / right leg extended. 2. Loop a belt or towel around the ball of your left / right foot. The ball of your foot is on the walking surface, right under your toes. 3. Keep your left / right ankle and foot relaxed and keep your knee straight while you use the belt or towel to pull your foot toward you. You should feel a gentle stretch behind your calf or knee in your gastroc muscle. 4. Hold this position for 10 seconds, then release to the starting position. 5. Repeat the exercise with your knee bent. You can put a pillow or a rolled bath towel under your knee to support it. You should feel a stretch deep in your calf in the soleus muscle or at your Achilles tendon. Repeat 10 times. Complete this exercise 2 times a day. Ankle alphabet   1. Sit with your left / right leg supported at the lower  leg. ? Do not rest your foot on anything. ? Make sure your foot has room to move freely. 2. Think of your left / right foot as a paintbrush. ? Move your foot to trace each letter of the alphabet in the air. Keep your hip and knee still while you trace. ? Make the letters as large as you can without feeling discomfort. 3. Trace every letter from A to Z. Repeat 10 times. Complete this exercise 2 times a day. Strengthening exercises These exercises build strength and endurance in your ankle and lower leg. Endurance is the ability to use your muscles for a long time, even after they get tired. Ankle dorsiflexion   1. Secure a rubber exercise band or tube to an object,  such as a table leg, that will stay still when the band is pulled. Secure the other end around your left / right foot. 2. Sit on the floor facing the object, with your left / right leg extended. The band or tube should be slightly tense when your foot is relaxed. 3. Slowly bring your foot toward you, bringing the top of your foot toward your shin (dorsiflexion), and pulling the band tighter. 4. Hold this position for 10 seconds. 5. Slowly return your foot to the starting position. Repeat 10 times. Complete this exercise 2 times a day. Ankle plantar flexion   1. Sit on the floor with your left / right leg extended. 2. Loop a rubber exercise tube or band around the ball of your left / right foot. The ball of your foot is on the walking surface, right under your toes. ? Hold the ends of the band or tube in your hands. ? The band or tube should be slightly tense when your foot is relaxed. 3. Slowly point your foot and toes downward to tilt the top of your foot away from your shin (plantar flexion). 4. Hold this position for 10 seconds. 5. Slowly return your foot to the starting position. Repeat 10 times. Complete this exercise 2 times a day. Ankle eversion 1. Sit on the floor with your legs straight out in front of you. 2. Loop  a rubber exercise band or tube around the ball of your left / right foot. The ball of your foot is on the walking surface, right under your toes. ? Hold the ends of the band in your hands, or secure the band to a stable object. ? The band or tube should be slightly tense when your foot is relaxed. 3. Slowly push your foot outward, away from your other leg (eversion). 4. Hold this position for 10 seconds. 5. Slowly return your foot to the starting position. Repeat 10 times. Complete this exercise 2 times a day. This information is not intended to replace advice given to you by your health care provider. Make sure you discuss any questions you have with your health care provider. Document Revised: 12/25/2018 Document Reviewed: 06/18/2018 Elsevier Patient Education  2020 [employer].

## 2020-08-24 NOTE — Progress Notes (Signed)
  Subjective:  Patient ID: Tanya Morales, female    DOB: March 04, 1929,  MRN: [id number]  Chief Complaint  Patient presents with  . Ankle Pain    "I twisted my ankle 4 days ago when I was walking in my garage.  Now its painful and hard to walk.  My ankle feels weak and there is pain in my foot to my toes"    86 y.o. female returns with the above complaint. History confirmed with patient.  Sprained her ankle recently.  The callus on the toes are also painful again would like them debrided.  Doing quite well, the callus region and the silicone caps have been very helpful, although she has quite difficulty putting them on.  Objective:  Physical Exam: warm, good capillary refill, no trophic changes or ulcerative lesions, normal DP and PT pulses and normal sensory exam. Left Foot: Third toe mild calluses plantar proximal phalanx, hammertoes rigid Right Foot: Heloma molle fourth interspace right foot on the lateral fourth toe  On the right ankle she has pain over the ATFL and CFL.  Pain with syndesmotic squeeze.  She has pain with proximal fibula as well  Radiographs: X-ray of the right ankle and tibia and fibula, no evidence of fracture dislocation or avulsion Assessment:   1. Sprain of right ankle, initial encounter   2. High ankle sprain, right, initial encounter   3. Corn or callus   4. Closed nondisplaced fracture of proximal phalanx of lesser toe of left foot, sequela   5. Osteoarthritis of toe joint, left      Plan:  Patient was evaluated and treated and all questions answered.  For the calluses we debrided these again today.  I discussed with her that we could consider surgical correction with arthroplasty of the left third toe so there is less pressure in this area.  I think this could help.  She will consider this and we will revisit at next visit.  On the right side she appears to have a severe ankle sprain with syndesmotic sprain as well.  No evidence of instability or  syndesmotic diastases.  No proximal fibular fracture noted.  We discussed RICE protocol, immobilization in a CAM boot and therapy.  She and I both agree that a CAM boot will be difficult for her to walk and she lives by herself.  Therefore let us try a Tri-Lock ankle brace for support and we will revisit this at next visit.  No follow-ups on file.

## 2020-09-16 DIAGNOSIS — L661 Lichen planopilaris: Secondary | ICD-10-CM | POA: Diagnosis not present

## 2020-10-02 DIAGNOSIS — Z23 Encounter for immunization: Secondary | ICD-10-CM | POA: Diagnosis not present

## 2020-10-05 ENCOUNTER — Ambulatory Visit: Payer: Medicare Other | Admitting: Podiatry

## 2020-10-06 ENCOUNTER — Telehealth: Payer: Self-pay | Admitting: *Deleted

## 2020-10-06 NOTE — Telephone Encounter (Signed)
Confirmed appointment Jan.24th @ 2:15.

## 2020-10-06 NOTE — Telephone Encounter (Signed)
Patient is wanting to reschedule appointment to Wednesday due to weather. Called on the 17th. Please contact [phone] .

## 2020-10-07 NOTE — Telephone Encounter (Signed)
complete

## 2020-10-12 ENCOUNTER — Ambulatory Visit (INDEPENDENT_AMBULATORY_CARE_PROVIDER_SITE_OTHER): Payer: Medicare Other | Admitting: Podiatry

## 2020-10-12 ENCOUNTER — Other Ambulatory Visit: Payer: Self-pay

## 2020-10-12 DIAGNOSIS — S92515S Nondisplaced fracture of proximal phalanx of left lesser toe(s), sequela: Secondary | ICD-10-CM

## 2020-10-12 DIAGNOSIS — M19072 Primary osteoarthritis, left ankle and foot: Secondary | ICD-10-CM

## 2020-10-12 DIAGNOSIS — M79675 Pain in left toe(s): Secondary | ICD-10-CM

## 2020-10-12 DIAGNOSIS — L84 Corns and callosities: Secondary | ICD-10-CM | POA: Diagnosis not present

## 2020-10-12 DIAGNOSIS — S93491D Sprain of other ligament of right ankle, subsequent encounter: Secondary | ICD-10-CM | POA: Diagnosis not present

## 2020-10-12 DIAGNOSIS — S93401A Sprain of unspecified ligament of right ankle, initial encounter: Secondary | ICD-10-CM

## 2020-10-12 DIAGNOSIS — S93401D Sprain of unspecified ligament of right ankle, subsequent encounter: Secondary | ICD-10-CM | POA: Diagnosis not present

## 2020-10-13 ENCOUNTER — Encounter: Payer: Self-pay | Admitting: Podiatry

## 2020-10-13 NOTE — Progress Notes (Signed)
  Subjective:  Patient ID: Tanya Morales, female    DOB: 06/21/1929,  MRN: [id number]  Chief Complaint  Patient presents with  . Foot Pain    Right foot is still bothering her some and the callus are starting to cause her pain    87 y.o. female returns with the above complaint. History confirmed with patient.   The callus on the left third toes are also painful again would like them debrided.  The right ankle is still painful.  She has swelling it when she walks.  Has been doing the exercise.  It was difficult for her to wear the AFO brace.  Objective:  Physical Exam: warm, good capillary refill, no trophic changes or ulcerative lesions, normal DP and PT pulses and normal sensory exam. Left Foot: Third toe mild calluses plantar proximal phalanx, hammertoes rigid   On the right ankle she has pain over the ATFL and CFL.  No gross instability.  Pain with resisted eversion.  No proximal leg pain today  Assessment:   1. Sprain of right ankle, subsequent encounter   2. Corn or callus   3. Closed nondisplaced fracture of proximal phalanx of lesser toe of left foot, sequela   4. Osteoarthritis of toe joint, left   5. High ankle sprain of right lower extremity, subsequent encounter   6. Pain in toe of left foot      Plan:  Patient was evaluated and treated and all questions answered.  For the calluses we debrided these again today.  I discussed with her that we could consider surgical correction with arthroplasty of the left third toe so there is less pressure in this area.  I think this would help.  We discussed in detail what the risks and benefits of this in the expected postoperative course would be.  She thinks would be difficult for her to do right now because she lives with her self and her family lives in the [place] in the [place].  She would like to avoid this for now.  I think this is reasonable considering her age even though she is very active.  Improving slightly from  the ankle sprain.  She is unable to wear many of the bracing.  May be best if she has some physical therapy, referral sent to [facility] in [place]  Return in about 3 weeks (around 11/02/2020).

## 2020-11-02 ENCOUNTER — Other Ambulatory Visit: Payer: Self-pay

## 2020-11-02 ENCOUNTER — Ambulatory Visit (INDEPENDENT_AMBULATORY_CARE_PROVIDER_SITE_OTHER): Payer: Medicare Other | Admitting: Podiatry

## 2020-11-02 DIAGNOSIS — S93401D Sprain of unspecified ligament of right ankle, subsequent encounter: Secondary | ICD-10-CM | POA: Diagnosis not present

## 2020-11-02 DIAGNOSIS — S93491D Sprain of other ligament of right ankle, subsequent encounter: Secondary | ICD-10-CM

## 2020-11-02 DIAGNOSIS — L84 Corns and callosities: Secondary | ICD-10-CM

## 2020-11-02 DIAGNOSIS — R52 Pain, unspecified: Secondary | ICD-10-CM

## 2020-11-02 DIAGNOSIS — S92515S Nondisplaced fracture of proximal phalanx of left lesser toe(s), sequela: Secondary | ICD-10-CM

## 2020-11-02 DIAGNOSIS — M19072 Primary osteoarthritis, left ankle and foot: Secondary | ICD-10-CM | POA: Diagnosis not present

## 2020-11-03 ENCOUNTER — Encounter: Payer: Self-pay | Admitting: Podiatry

## 2020-11-03 NOTE — Progress Notes (Signed)
  Subjective:  Patient ID: Tanya Morales, female    DOB: Sep 18, 1929,  MRN: [id number]  Chief Complaint  Patient presents with  . Foot Pain    Right foot pain. PT stated that she is still having some pain with that right foot     87 y.o. female returns with the above complaint. History confirmed with patient.   The callus on the left third toes are also painful again would like them debrided.  The right ankle is still painful.  She did not start physical therapy.   Objective:  Physical Exam: warm, good capillary refill, no trophic changes or ulcerative lesions, normal DP and PT pulses and normal sensory exam. Left Foot: Third toe mild calluses plantar proximal phalanx, hammertoes rigid   On the right ankle again she has pain over the ATFL and CFL.  No gross instability.  Pain with resisted eversion.  No proximal leg pain today  Assessment:   No diagnosis found.   Plan:  Patient was evaluated and treated and all questions answered.  All symptomatic hyperkeratoses were safely debrided with a sterile #15 blade to patient's level of comfort without incident. We discussed preventative and palliative care of these lesions including supportive and accommodative shoegear, padding, prefabricated and custom molded accommodative orthoses, use of a pumice stone and lotions/creams daily.  Encouraged her to start PT at [facility], she will call to schedule  Return in about 4 weeks (around 11/30/2020).

## 2020-11-10 ENCOUNTER — Ambulatory Visit: Payer: Medicare Other | Admitting: Podiatry

## 2020-11-25 DIAGNOSIS — M25675 Stiffness of left foot, not elsewhere classified: Secondary | ICD-10-CM | POA: Diagnosis not present

## 2020-11-25 DIAGNOSIS — M25572 Pain in left ankle and joints of left foot: Secondary | ICD-10-CM | POA: Diagnosis not present

## 2020-11-25 DIAGNOSIS — R2689 Other abnormalities of gait and mobility: Secondary | ICD-10-CM | POA: Diagnosis not present

## 2020-11-25 DIAGNOSIS — M25674 Stiffness of right foot, not elsewhere classified: Secondary | ICD-10-CM | POA: Diagnosis not present

## 2020-11-25 DIAGNOSIS — R531 Weakness: Secondary | ICD-10-CM | POA: Diagnosis not present

## 2020-11-25 DIAGNOSIS — M25571 Pain in right ankle and joints of right foot: Secondary | ICD-10-CM | POA: Diagnosis not present

## 2020-11-30 ENCOUNTER — Other Ambulatory Visit: Payer: Self-pay

## 2020-11-30 ENCOUNTER — Ambulatory Visit (INDEPENDENT_AMBULATORY_CARE_PROVIDER_SITE_OTHER): Payer: Medicare Other | Admitting: Podiatry

## 2020-11-30 DIAGNOSIS — L84 Corns and callosities: Secondary | ICD-10-CM | POA: Diagnosis not present

## 2020-11-30 DIAGNOSIS — M79675 Pain in left toe(s): Secondary | ICD-10-CM | POA: Diagnosis not present

## 2020-11-30 DIAGNOSIS — S92515S Nondisplaced fracture of proximal phalanx of left lesser toe(s), sequela: Secondary | ICD-10-CM | POA: Diagnosis not present

## 2020-11-30 DIAGNOSIS — S93401D Sprain of unspecified ligament of right ankle, subsequent encounter: Secondary | ICD-10-CM | POA: Diagnosis not present

## 2020-12-01 DIAGNOSIS — R531 Weakness: Secondary | ICD-10-CM | POA: Diagnosis not present

## 2020-12-01 DIAGNOSIS — M25572 Pain in left ankle and joints of left foot: Secondary | ICD-10-CM | POA: Diagnosis not present

## 2020-12-01 DIAGNOSIS — M25675 Stiffness of left foot, not elsewhere classified: Secondary | ICD-10-CM | POA: Diagnosis not present

## 2020-12-01 DIAGNOSIS — M25674 Stiffness of right foot, not elsewhere classified: Secondary | ICD-10-CM | POA: Diagnosis not present

## 2020-12-01 DIAGNOSIS — M25571 Pain in right ankle and joints of right foot: Secondary | ICD-10-CM | POA: Diagnosis not present

## 2020-12-01 DIAGNOSIS — R2689 Other abnormalities of gait and mobility: Secondary | ICD-10-CM | POA: Diagnosis not present

## 2020-12-03 ENCOUNTER — Encounter: Payer: Self-pay | Admitting: Podiatry

## 2020-12-03 NOTE — Progress Notes (Signed)
  Subjective:  Patient ID: Tanya Morales, female    DOB: 05/02/1929,  MRN: [id number]  Chief Complaint  Patient presents with  . Foot Pain    PT stated that the last few days her foot has been bothering her she stated that she is doing physical therapy     87 y.o. female returns with the above complaint. History confirmed with patient.   She has been doing physical therapy she felt like it hurt her more at first  Objective:  Physical Exam: warm, good capillary refill, no trophic changes or ulcerative lesions, normal DP and PT pulses and normal sensory exam. Left Foot: Third toe mild calluses plantar proximal phalanx, hammertoes rigid   On the right ankle again she has pain over the ATFL and CFL.  No gross instability.  Pain with resisted eversion.  No proximal leg pain today.  This is improved quite a bit since she initially injured it  Assessment:   No diagnosis found.   Plan:  Patient was evaluated and treated and all questions answered.  All symptomatic hyperkeratoses were safely debrided with a sterile #15 blade to patient's level of comfort without incident. We discussed preventative and palliative care of these lesions including supportive and accommodative shoegear, padding, prefabricated and custom molded accommodative orthoses, use of a pumice stone and lotions/creams daily.  I think she should continue to get physical therapy, it is helping her know it is uncomfortable at first when she initially did it with them.  I do not think an MRI would be warranted at this point, I do not foresee any surgical intervention being something that would be considered for her.  If she is not improving much by next visit we should consider a more significant brace such as a Moore balance brace that she would be able to wear for stability and to help her walking  Return in about 6 weeks (around 01/11/2021).

## 2020-12-11 DIAGNOSIS — E785 Hyperlipidemia, unspecified: Secondary | ICD-10-CM | POA: Diagnosis not present

## 2020-12-11 DIAGNOSIS — E039 Hypothyroidism, unspecified: Secondary | ICD-10-CM | POA: Diagnosis not present

## 2020-12-11 DIAGNOSIS — R7301 Impaired fasting glucose: Secondary | ICD-10-CM | POA: Diagnosis not present

## 2020-12-11 DIAGNOSIS — M810 Age-related osteoporosis without current pathological fracture: Secondary | ICD-10-CM | POA: Diagnosis not present

## 2020-12-15 DIAGNOSIS — L661 Lichen planopilaris: Secondary | ICD-10-CM | POA: Diagnosis not present

## 2020-12-18 DIAGNOSIS — M810 Age-related osteoporosis without current pathological fracture: Secondary | ICD-10-CM | POA: Diagnosis not present

## 2020-12-18 DIAGNOSIS — I10 Essential (primary) hypertension: Secondary | ICD-10-CM | POA: Diagnosis not present

## 2020-12-18 DIAGNOSIS — G459 Transient cerebral ischemic attack, unspecified: Secondary | ICD-10-CM | POA: Diagnosis not present

## 2020-12-18 DIAGNOSIS — R7301 Impaired fasting glucose: Secondary | ICD-10-CM | POA: Diagnosis not present

## 2020-12-18 DIAGNOSIS — E785 Hyperlipidemia, unspecified: Secondary | ICD-10-CM | POA: Diagnosis not present

## 2020-12-18 DIAGNOSIS — R809 Proteinuria, unspecified: Secondary | ICD-10-CM | POA: Diagnosis not present

## 2020-12-18 DIAGNOSIS — M1990 Unspecified osteoarthritis, unspecified site: Secondary | ICD-10-CM | POA: Diagnosis not present

## 2020-12-18 DIAGNOSIS — Z780 Asymptomatic menopausal state: Secondary | ICD-10-CM | POA: Diagnosis not present

## 2020-12-18 DIAGNOSIS — R82998 Other abnormal findings in urine: Secondary | ICD-10-CM | POA: Diagnosis not present

## 2020-12-18 DIAGNOSIS — Z853 Personal history of malignant neoplasm of breast: Secondary | ICD-10-CM | POA: Diagnosis not present

## 2020-12-18 DIAGNOSIS — D539 Nutritional anemia, unspecified: Secondary | ICD-10-CM | POA: Diagnosis not present

## 2020-12-18 DIAGNOSIS — Z0000 Encounter for general adult medical examination without abnormal findings: Secondary | ICD-10-CM | POA: Diagnosis not present

## 2020-12-18 DIAGNOSIS — Z1331 Encounter for screening for depression: Secondary | ICD-10-CM | POA: Diagnosis not present

## 2021-01-19 ENCOUNTER — Ambulatory Visit: Payer: Medicare Other | Admitting: Podiatry

## 2021-01-19 ENCOUNTER — Other Ambulatory Visit: Payer: Self-pay

## 2021-01-19 ENCOUNTER — Ambulatory Visit (INDEPENDENT_AMBULATORY_CARE_PROVIDER_SITE_OTHER): Payer: Medicare Other | Admitting: Podiatry

## 2021-01-19 DIAGNOSIS — S93401D Sprain of unspecified ligament of right ankle, subsequent encounter: Secondary | ICD-10-CM | POA: Diagnosis not present

## 2021-01-19 DIAGNOSIS — R52 Pain, unspecified: Secondary | ICD-10-CM

## 2021-01-19 DIAGNOSIS — L84 Corns and callosities: Secondary | ICD-10-CM

## 2021-01-20 ENCOUNTER — Encounter: Payer: Self-pay | Admitting: Podiatry

## 2021-01-20 NOTE — Progress Notes (Signed)
  Subjective:  Patient ID: Tanya Morales, female    DOB: [birth year],  MRN: [id number]  Chief Complaint  Patient presents with  . Foot Pain    Right ankle sprain, 6 week follow up    87 y.o. female returns with the above complaint. History confirmed with patient.  Doing well thinks the ankle hurts occasionally  Objective:  Physical Exam: warm, good capillary refill, no trophic changes or ulcerative lesions, normal DP and PT pulses and normal sensory exam. Left Foot: Third toe mild calluses plantar proximal phalanx, hammertoes rigid   Minimal pain on the right ankle today, no gross instability, no proximal leg pain.  Assessment:   1. Sprain of right ankle, subsequent encounter   2. Corn or callus   3. Pain      Plan:  Patient was evaluated and treated and all questions answered.  All symptomatic hyperkeratoses were safely debrided with a sterile #15 blade to patient's level of comfort without incident. We discussed preventative and palliative care of these lesions including supportive and accommodative shoegear, padding, prefabricated and custom molded accommodative orthoses, use of a pumice stone and lotions/creams daily.  She is finished physical therapy.  She continue her home exercises and wear the brace as needed  Return in about 8 weeks (around 03/16/2021) for callus on left foot toes and ankle sprain right .

## 2021-02-17 ENCOUNTER — Emergency Department (HOSPITAL_BASED_OUTPATIENT_CLINIC_OR_DEPARTMENT_OTHER)
Admission: EM | Admit: 2021-02-17 | Discharge: 2021-02-17 | Disposition: A | Discharge status: Home / Self Care | Payer: Medicare Other | Attending: Emergency Medicine | Admitting: Emergency Medicine

## 2021-02-17 ENCOUNTER — Encounter (HOSPITAL_BASED_OUTPATIENT_CLINIC_OR_DEPARTMENT_OTHER): Payer: Self-pay | Admitting: *Deleted

## 2021-02-17 ENCOUNTER — Other Ambulatory Visit: Payer: Self-pay

## 2021-02-17 DIAGNOSIS — E039 Hypothyroidism, unspecified: Secondary | ICD-10-CM | POA: Diagnosis not present

## 2021-02-17 DIAGNOSIS — Z7982 Long term (current) use of aspirin: Secondary | ICD-10-CM | POA: Insufficient documentation

## 2021-02-17 DIAGNOSIS — U071 COVID-19: Secondary | ICD-10-CM | POA: Insufficient documentation

## 2021-02-17 DIAGNOSIS — Z96642 Presence of left artificial hip joint: Secondary | ICD-10-CM | POA: Diagnosis not present

## 2021-02-17 DIAGNOSIS — Z853 Personal history of malignant neoplasm of breast: Secondary | ICD-10-CM | POA: Diagnosis not present

## 2021-02-17 DIAGNOSIS — N3000 Acute cystitis without hematuria: Secondary | ICD-10-CM | POA: Insufficient documentation

## 2021-02-17 DIAGNOSIS — Z79899 Other long term (current) drug therapy: Secondary | ICD-10-CM | POA: Diagnosis not present

## 2021-02-17 DIAGNOSIS — I10 Essential (primary) hypertension: Secondary | ICD-10-CM | POA: Diagnosis not present

## 2021-02-17 DIAGNOSIS — Z87891 Personal history of nicotine dependence: Secondary | ICD-10-CM | POA: Insufficient documentation

## 2021-02-17 DIAGNOSIS — J029 Acute pharyngitis, unspecified: Secondary | ICD-10-CM | POA: Diagnosis present

## 2021-02-17 LAB — COMPREHENSIVE METABOLIC PANEL
ALT: 13 U/L (ref 0–44)
AST: 25 U/L (ref 15–41)
Albumin: 4.3 g/dL (ref 3.5–5.0)
Alkaline Phosphatase: 55 U/L (ref 38–126)
Anion gap: 13 (ref 5–15)
BUN: 16 mg/dL (ref 8–23)
CO2: 23 mmol/L (ref 22–32)
Calcium: 9.6 mg/dL (ref 8.9–10.3)
Chloride: 99 mmol/L (ref 98–111)
Creatinine, Ser: 0.89 mg/dL (ref 0.44–1.00)
GFR, Estimated: >60 mL/min (ref >60)
Glucose, Bld: 96 mg/dL (ref 70–99)
Potassium: 4.1 mmol/L (ref 3.5–5.1)
Sodium: 135 mmol/L (ref 135–145)
Total Bilirubin: 0.5 mg/dL (ref 0.3–1.2)
Total Protein: 7 g/dL (ref 6.5–8.1)

## 2021-02-17 LAB — CBC WITH DIFFERENTIAL/PLATELET
Abs Immature Granulocytes: 0.02 10*3/uL (ref 0.00–0.07)
Basophils Absolute: 0 10*3/uL (ref 0.0–0.1)
Basophils Relative: 1 %
Eosinophils Absolute: 0 10*3/uL (ref 0.0–0.5)
Eosinophils Relative: 0 %
HCT: 41.8 % (ref 36.0–46.0)
Hemoglobin: 14.2 g/dL (ref 12.0–15.0)
Immature Granulocytes: 0 %
Lymphocytes Relative: 12 %
Lymphs Abs: 0.8 10*3/uL (ref 0.7–4.0)
MCH: 33 pg (ref 26.0–34.0)
MCHC: 34 g/dL (ref 30.0–36.0)
MCV: 97.2 fL (ref 80.0–100.0)
Monocytes Absolute: 1 10*3/uL (ref 0.1–1.0)
Monocytes Relative: 15 %
Neutro Abs: 4.5 10*3/uL (ref 1.7–7.7)
Neutrophils Relative %: 72 %
Platelets: 246 10*3/uL (ref 150–400)
RBC: 4.3 MIL/uL (ref 3.87–5.11)
RDW: 13.9 % (ref 11.5–15.5)
WBC: 6.3 10*3/uL (ref 4.0–10.5)
nRBC: 0 % (ref 0.0–0.2)

## 2021-02-17 LAB — URINALYSIS, ROUTINE W REFLEX MICROSCOPIC
Bilirubin Urine: NEGATIVE
Glucose, UA: NEGATIVE mg/dL
Hgb urine dipstick: NEGATIVE
Nitrite: NEGATIVE
Specific Gravity, Urine: 1.006 (ref 1.005–1.030)
pH: 7 (ref 5.0–8.0)

## 2021-02-17 LAB — RESP PANEL BY RT-PCR (FLU A&B, COVID) ARPGX2
Influenza A by PCR: NEGATIVE
Influenza B by PCR: NEGATIVE
SARS Coronavirus 2 by RT PCR: POSITIVE — AB

## 2021-02-17 LAB — LIPASE, BLOOD: Lipase: 25 U/L (ref 11–51)

## 2021-02-17 MED ORDER — CEPHALEXIN 500 MG PO CAPS: 500.0000 mg | ORAL_CAPSULE | Freq: Two times a day (BID) | ORAL | 0 refills | Status: AC

## 2021-02-17 MED ORDER — CEPHALEXIN 250 MG PO CAPS
500.0000 mg | ORAL_CAPSULE | Freq: Once | ORAL | Status: AC
  Administered 2021-02-17: 500 mg via ORAL
  Filled 2021-02-17: qty 2

## 2021-02-17 MED ORDER — NIRMATRELVIR/RITONAVIR (PAXLOVID)TABLET: 3.0000 | ORAL_TABLET | Freq: Two times a day (BID) | ORAL | 0 refills | Status: AC

## 2021-02-17 NOTE — ED Triage Notes (Addendum)
Bilateral lower abdominal pain since Sunday.  Denies n/v.  Pt also stated that she has sore throat.   She is also concerned if she has Covid.

## 2021-02-17 NOTE — ED Provider Notes (Signed)
[facility] EMERGENCY DEPT Provider Note   CSN: [id number] Arrival date & time: 02/17/21  1614     History Chief Complaint  Patient presents with  . Sore Throat  . Abdominal Pain    Tanya Morales is a 87 y.o. female presented emerged part of abdominal pain.  She is here with a caretaker.  The patient reports that she has been having cramping abdominal pain since Sunday.  She denies nausea or vomiting.  She reports some loose bowel movements.  She reports that her sore throat for the past 24 hours and may have a subjective fever at home.  She wonders if she may have COVID.  She has had 3 doses of the COVID-vaccine.  She denies cough, congestion.  She reports a very mild frontal headache.  She otherwise feels well today.  HPI     Past Medical History:  Diagnosis Date  . Arthritis    OA  . Breast cancer, stage 1 ([facility]) 03/14/2011   left BREAST, AND 4 WEEKS RADIATION  . Chronic back pain    LOWER  . Dislocated shoulder, right, subsequent encounter    long history  . Dizziness of unknown cause 2019  . Heart murmur   . Hyperlipidemia   . Hypertension   . Hypothyroidism   . Personal history of radiation therapy   . Raynaud disease   . Spinal headache 61 YRS AGO AFTER CHILDBIRTH  . TIA (transient ischemic attack) march 2012    Patient Active Problem List   Diagnosis Date Noted  . Corn or callus 08/05/2020  . Toe fracture, left 05/13/2020  . Status post left hip replacement 11/04/2016  . Abnormal nuclear stress test   . Arthritis of midfoot 05/24/2016  . Osteoarthritis of left hip 04/21/2016  . Neck pain 11/04/2014  . Midline low back pain without sciatica 11/04/2014  . Breast cancer, stage 1 ([facility]) 03/14/2011  . Postop check 03/14/2011    Past Surgical History:  Procedure Laterality Date  . BREAST LUMPECTOMY Left 2012  . BREAST SURGERY Left 02/23/11   left lumpectomy  . CARDIAC CATHETERIZATION N/A 10/12/2016   Procedure: Left Heart Cath and Coronary  Angiography;  Surgeon: Jettie Booze, MD;  Location: [facility];  Service: Cardiovascular;  Laterality: N/A;  . COLONSCOPY  2009  . EYE SURGERY Bilateral 2013   IOC LENS FOR CATARACTS  . FINGER SURGERY    . TONSILLECTOMY AND ADENOIDECTOMY    . TOTAL ABDOMINAL HYSTERECTOMY  1974   COMPLETE  . TOTAL HIP ARTHROPLASTY Left 11/04/2016   Procedure: LEFT TOTAL HIP ARTHROPLASTY ANTERIOR APPROACH;  Surgeon: Mcarthur Rossetti, MD;  Location: [facility];  Service: Orthopedics;  Laterality: Left;     OB History   No obstetric history on file.     Family History  Problem Relation Age of Onset  . Congestive Heart Failure Mother   . Cancer Maternal Grandmother   . Rheum arthritis Sister   . Healthy Sister   . Other Son        CHRONIC INFLAMMATORY DEMYELINATING POLYNEUROPATHY  . Healthy Son     Social History   Tobacco Use  . Smoking status: Former Smoker    Types: Cigarettes    Quit date: 09/20/1967    Years since quitting: 53.4  . Smokeless tobacco: Never Used  . Tobacco comment: quit 1969  Vaping Use  . Vaping Use: Never used  Substance Use Topics  . Alcohol use: No  . Drug use:  No    Home Medications Prior to Admission medications   Medication Sig Start Date End Date Taking? Authorizing Provider  amLODipine (NORVASC) 5 MG tablet Take 5 mg by mouth every morning.   Yes [provider]  aspirin 325 MG tablet Take 81 mg by mouth daily.   Yes [provider]  Calcium-Magnesium 500-250 MG TABS Take 1 tablet by mouth daily.   Yes [provider]  cephALEXin (KEFLEX) 500 MG capsule Take 1 capsule (500 mg total) by mouth 2 (two) times daily for 5 days. 02/17/21 02/22/21 Yes Payson Crumby, Carola Rhine, MD  cholecalciferol (VITAMIN D) 400 UNITS TABS tablet Take 400 Units by mouth daily.    Yes [provider]  clobetasol (TEMOVATE) 0.05 % external solution  02/05/20  Yes [provider]  co-enzyme Q-10 30 MG capsule Take 30 mg by mouth daily.    Yes [provider]  levothyroxine (SYNTHROID, LEVOTHROID) 50 MCG tablet Take 50 mcg by mouth daily before breakfast.   Yes [provider]  Multiple Vitamin (MULTIVITAMIN WITH MINERALS) TABS tablet Take 1 tablet by mouth daily.   Yes [provider]  nirmatrelvir/ritonavir EUA (PAXLOVID) TABS Take 3 tablets by mouth 2 (two) times daily for 5 days. Patient GFR is 60 Take nirmatrelvir (150 mg) two tablets twice daily for 5 days and ritonavir (100 mg) one tablet twice daily for 5 days. 02/17/21 02/22/21 Yes Eliah Marquard, Carola Rhine, MD  olmesartan (BENICAR) 20 MG tablet Take 20 mg by mouth every morning.   Yes [provider]  Probiotic Product (PROBIOTIC PO) Take 1 capsule by mouth daily.   Yes [provider]  simvastatin (ZOCOR) 10 MG tablet Take 10 mg by mouth at bedtime.    Yes [provider]  vitamin C (ASCORBIC ACID) 500 MG tablet Take 500 mg by mouth daily.   Yes [provider]  moxifloxacin (VIGAMOX) 0.5 % ophthalmic solution Apply to eye. 07/25/20   [provider]  tobramycin (TOBREX) 0.3 % ophthalmic solution Place 1 drop into the right eye 4 (four) times daily. 12/26/19   [provider]    Allergies    Celebrex [celecoxib] and Epinephrine  Review of Systems   Review of Systems  Constitutional: Negative for chills and fever.  HENT: Positive for sore throat. Negative for ear pain.   Eyes: Negative for pain and visual disturbance.  Respiratory: Negative for cough and shortness of breath.   Cardiovascular: Negative for chest pain and palpitations.  Gastrointestinal: Negative for abdominal pain and vomiting.  Genitourinary: Negative for dysuria and hematuria.  Musculoskeletal: Negative for arthralgias and back pain.  Skin: Negative for color change and rash.  Neurological: Negative for seizures and syncope.  All other systems reviewed and are negative.   Physical Exam Updated Vital Signs BP (!) 169/103 (BP  Location: Right Arm)   Pulse 81   Temp (!) 100.4 F (38 C) (Oral)   Resp 18   SpO2 98%   Physical Exam Constitutional:      General: She is not in acute distress. HENT:     Head: Normocephalic and atraumatic.     Mouth/Throat:     Mouth: Mucous membranes are moist. No oral lesions.     Pharynx: No pharyngeal swelling, oropharyngeal exudate or uvula swelling.  Eyes:     Conjunctiva/sclera: Conjunctivae normal.     Pupils: Pupils are equal, round, and reactive to light.  Cardiovascular:     Rate and Rhythm: Normal rate and regular  rhythm.  Pulmonary:     Effort: Pulmonary effort is normal. No respiratory distress.  Abdominal:     General: There is no distension.     Tenderness: There is no abdominal tenderness.  Skin:    General: Skin is warm and dry.  Neurological:     General: No focal deficit present.     Mental Status: She is alert. Mental status is at baseline.  Psychiatric:        Mood and Affect: Mood normal.        Behavior: Behavior normal.     ED Results / Procedures / Treatments   Labs (all labs ordered are listed, but only abnormal results are displayed) Labs Reviewed  RESP PANEL BY RT-PCR (FLU A&B, COVID) ARPGX2 - Abnormal; Notable for the following components:      Result Value   SARS Coronavirus 2 by RT PCR POSITIVE (*)    All other components within normal limits  URINALYSIS, ROUTINE W REFLEX MICROSCOPIC - Abnormal; Notable for the following components:   Ketones, ur TRACE (*)    Protein, ur TRACE (*)    Leukocytes,Ua LARGE (*)    Bacteria, UA RARE (*)    Non Squamous Epithelial 0-5 (*)    All other components within normal limits  URINE CULTURE  COMPREHENSIVE METABOLIC PANEL  CBC WITH DIFFERENTIAL/PLATELET  LIPASE, BLOOD    EKG None  Radiology No results found.  Procedures Procedures   Medications Ordered in ED Medications  cephALEXin (KEFLEX) capsule 500 mg (500 mg Oral Given 02/17/21 1821)    ED Course  I have reviewed the triage  vital signs and the nursing notes.  Pertinent labs & imaging results that were available during my care of the patient were reviewed by me and considered in my medical decision making (see chart for details).  DDx includes infection vs dehydration vs anemia vs other  Labs reviewed.  Covid positive.  Likely the source of her sore throat.  Doubt strep or deep space throat infection.  UA with likely UTI.  We'll start on keflex and send a culture.  Otherwise labs look good here - CMP and CBC normal, no anemia or leukocytosis.  I doubt sepsis.  Doubt ACS.    Discussed management at home.  She may be a candidate for paxlovid but cannot recall exactly how many days of symptoms she's had: thinks it started Saturday, 5 days ago.   I can prescribe this along with keflex.  We discussed stopping her statin if she start paxlovid for the next 10 days.  No hypoxia or respiratory distress today.  I do not see a clinical indication for hospitalization at this time, and she is well appearing for discharge.  Clinical Course as of 02/17/21 2359  Wed Feb 17, 2021  1814 Likely UTI - ordered Keflex [MT]    Clinical Course User Index [MT] Shalin Linders, Carola Rhine, MD    Final Clinical Impression(s) / ED Diagnoses Final diagnoses:  Acute cystitis without hematuria  COVID-19    Rx / DC Orders ED Discharge Orders         Ordered    nirmatrelvir/ritonavir EUA (PAXLOVID) TABS  2 times daily        02/17/21 1907    cephALEXin (KEFLEX) 500 MG capsule  2 times daily        02/17/21 1907           Wyvonnia Dusky, MD 02/17/21 2359

## 2021-02-17 NOTE — Discharge Instructions (Addendum)
You were diagnosed with Covid today.    You MAY be eligible for a medicine called Paxlovid.  which are pills that help with the virus.  I prescribed this to your pharmacy.  Please go to the pharmacy to pick up this medication.  This medication is only indicated if you have been having symptoms for less than 5 days.  You could not recall how many days you have had symptoms for.  You will need to figure this out when you arrive at the pharmacy.  If you do start taking Paxil bid, it is very important that you stop the simvastatin.  This is a cholesterol medicine to take at bedtime.  You should stop this medication for a full 10 days, then you can restart it.  Finally, you are diagnosed with a urine infection today.  I gave you an antibiotic called Keflex in the ER.  You will need to pick up the prescription I sent to the pharmacy and complete the full course of antibiotics.

## 2021-02-17 NOTE — ED Notes (Signed)
States has been having congestion for over a week.  Denies cough.  States developed generalized abdominal pain last night.  Denies nauseas or vomiting

## 2021-02-19 LAB — URINE CULTURE

## 2021-02-23 DIAGNOSIS — U071 COVID-19: Secondary | ICD-10-CM | POA: Diagnosis not present

## 2021-02-23 DIAGNOSIS — Z20822 Contact with and (suspected) exposure to covid-19: Secondary | ICD-10-CM | POA: Diagnosis not present

## 2021-02-25 ENCOUNTER — Encounter (HOSPITAL_COMMUNITY): Payer: Medicare Other

## 2021-03-08 ENCOUNTER — Encounter: Payer: Self-pay | Admitting: Podiatry

## 2021-03-08 ENCOUNTER — Other Ambulatory Visit: Payer: Self-pay

## 2021-03-08 ENCOUNTER — Ambulatory Visit (INDEPENDENT_AMBULATORY_CARE_PROVIDER_SITE_OTHER): Payer: Medicare Other | Admitting: Podiatry

## 2021-03-08 DIAGNOSIS — L84 Corns and callosities: Secondary | ICD-10-CM | POA: Diagnosis not present

## 2021-03-08 DIAGNOSIS — M79674 Pain in right toe(s): Secondary | ICD-10-CM

## 2021-03-08 DIAGNOSIS — M2042 Other hammer toe(s) (acquired), left foot: Secondary | ICD-10-CM | POA: Diagnosis not present

## 2021-03-08 DIAGNOSIS — M2041 Other hammer toe(s) (acquired), right foot: Secondary | ICD-10-CM | POA: Diagnosis not present

## 2021-03-08 NOTE — Progress Notes (Signed)
  Subjective:  Patient ID: Tanya Morales, female    DOB: 1929/09/15,  MRN: [id number]  Chief Complaint  Patient presents with   Callouses    Pt states that she has callouses on her 85rd and 4th toe.      87 y.o. female returns with the above complaint. History confirmed with patient.  Having pain again the calluses have returned significantly which is new on the medial right fifth toe  Objective:  Physical Exam: warm, good capillary refill, no trophic changes or ulcerative lesions, normal DP and PT pulses and normal sensory exam. Left Foot: Third and fourth toe mild calluses plantar proximal phalanx, hammertoes rigid Right foot: Heloma molle medial fifth toe with adductovarus toe rotation mycotic thickened yellowed nail with dystrophy  Minimal pain on the right ankle today, no gross instability, no proximal leg pain.  Assessment:   1. Hammertoe of left foot   2. Hammertoe of right foot   3. Callus of foot   4. Pain due to onychomycosis of toenails of both feet       Plan:  Patient was evaluated and treated and all questions answered.  All symptomatic hyperkeratoses were safely debrided with a sterile #15 blade to patient's level of comfort without incident. We discussed preventative and palliative care of these lesions including supportive and accommodative shoegear, padding, prefabricated and custom molded accommodative orthoses, use of a pumice stone and lotions/creams daily.  Discussed the etiology and treatment options for the condition in detail with the patient. Educated patient on the topical and oral treatment options for mycotic nails. Recommended debridement of the nails today. Sharp and mechanical debridement performed of all painful and mycotic nails today. Nails debrided in length and thickness using a nail nipper to level of comfort. Discussed treatment options including appropriate shoe gear. Follow up as needed for painful nails.    Return in about 4 weeks  (around 04/05/2021) for toe pain, calluses.

## 2021-03-16 ENCOUNTER — Ambulatory Visit: Payer: Medicare Other | Admitting: Podiatry

## 2021-04-02 DIAGNOSIS — L661 Lichen planopilaris: Secondary | ICD-10-CM | POA: Diagnosis not present

## 2021-04-05 ENCOUNTER — Ambulatory Visit (INDEPENDENT_AMBULATORY_CARE_PROVIDER_SITE_OTHER): Payer: Medicare Other | Admitting: Podiatry

## 2021-04-05 ENCOUNTER — Other Ambulatory Visit: Payer: Self-pay

## 2021-04-05 DIAGNOSIS — M2041 Other hammer toe(s) (acquired), right foot: Secondary | ICD-10-CM

## 2021-04-05 DIAGNOSIS — L84 Corns and callosities: Secondary | ICD-10-CM | POA: Diagnosis not present

## 2021-04-05 DIAGNOSIS — R52 Pain, unspecified: Secondary | ICD-10-CM

## 2021-04-05 DIAGNOSIS — M2042 Other hammer toe(s) (acquired), left foot: Secondary | ICD-10-CM | POA: Diagnosis not present

## 2021-04-07 NOTE — Progress Notes (Signed)
  Subjective:  Patient ID: Tanya Morales, female    DOB: Jun 22, 1929,  MRN: [id number]  Chief Complaint  Patient presents with   Hammer Toe    Hammertoe,  4 week follow up for toe pain,   Callouses     calluses follow up    87 y.o. female returns with the above complaint. History confirmed with patient.  She is doing well she is now having pain again the calluses have returned significantly which is new on the medial right fifth toe  Objective:  Physical Exam: warm, good capillary refill, no trophic changes or ulcerative lesions, normal DP and PT pulses and normal sensory exam. Left Foot: Third and fourth toe mild calluses plantar proximal phalanx, hammertoes rigid Right foot: Heloma molle medial fifth toe with adductovarus toe rotation mycotic thickened yellowed nail with dystrophy  Minimal pain on the right ankle today, no gross instability, no proximal leg pain.  Assessment:   1. Callus of foot   2. Pain   3. Hammertoe of left foot   4. Hammertoe of right foot        Plan:  Patient was evaluated and treated and all questions answered.  All symptomatic hyperkeratoses were safely debrided with a sterile #15 blade to patient's level of comfort without incident. We discussed preventative and palliative care of these lesions including supportive and accommodative shoegear, padding, prefabricated and custom molded accommodative orthoses, use of a pumice stone and lotions/creams daily.   We discussed that correction of the hammertoes and prior arthritic fractures could offer some relief but may not be advisable at her age and would be with risk.   Return in about 4 weeks (around 05/03/2021) for calluses .

## 2021-05-03 ENCOUNTER — Other Ambulatory Visit: Payer: Self-pay

## 2021-05-03 ENCOUNTER — Ambulatory Visit (INDEPENDENT_AMBULATORY_CARE_PROVIDER_SITE_OTHER): Payer: Medicare Other | Admitting: Podiatry

## 2021-05-03 DIAGNOSIS — L84 Corns and callosities: Secondary | ICD-10-CM | POA: Diagnosis not present

## 2021-05-03 DIAGNOSIS — R262 Difficulty in walking, not elsewhere classified: Secondary | ICD-10-CM | POA: Diagnosis not present

## 2021-05-04 ENCOUNTER — Encounter: Payer: Self-pay | Admitting: Podiatry

## 2021-05-04 NOTE — Progress Notes (Signed)
  Subjective:  Patient ID: Tanya Morales, female    DOB: 1929-03-15,  MRN: [id number]  Chief Complaint  Patient presents with   Callouses    for calluses     87 y.o. female returns with the above complaint. History confirmed with patient.  She is doing well she is now having pain again the calluses have returned significantly which is new on the medial right fifth toe.  She has also had difficulty walking and feels like she is unsteady on her feet often  Objective:  Physical Exam: warm, good capillary refill, no trophic changes or ulcerative lesions, normal DP and PT pulses and normal sensory exam. 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      Plan:  Patient was evaluated and treated and all questions answered.  All symptomatic hyperkeratoses were safely debrided with a sterile #15 blade to patient's level of comfort without incident. We discussed preventative and palliative care of these lesions including supportive and accommodative shoegear, padding, prefabricated and custom molded accommodative orthoses, use of a pumice stone and lotions/creams daily.   Discussed the option of proprioceptive training and gait mobility training with physical therapy.  She previously had a therapist at [facility] physical therapy she thinks.  She will consider this and we will revisit this at the next visit   Return in about 4 weeks (around 05/31/2021).

## 2021-05-27 ENCOUNTER — Ambulatory Visit (INDEPENDENT_AMBULATORY_CARE_PROVIDER_SITE_OTHER): Payer: Medicare Other | Admitting: Podiatry

## 2021-05-27 ENCOUNTER — Other Ambulatory Visit: Payer: Self-pay

## 2021-05-27 DIAGNOSIS — M2041 Other hammer toe(s) (acquired), right foot: Secondary | ICD-10-CM

## 2021-05-27 DIAGNOSIS — L84 Corns and callosities: Secondary | ICD-10-CM

## 2021-05-27 DIAGNOSIS — R262 Difficulty in walking, not elsewhere classified: Secondary | ICD-10-CM

## 2021-05-27 DIAGNOSIS — M2042 Other hammer toe(s) (acquired), left foot: Secondary | ICD-10-CM | POA: Diagnosis not present

## 2021-05-28 NOTE — Progress Notes (Signed)
  Subjective:  Patient ID: Tanya Morales, female    DOB: 02-15-1929,  MRN: [id number]  Chief Complaint  Patient presents with   Hammer Toe    Painful callus lesion left third toe    87 y.o. female returns with the above complaint. History confirmed with patient.  She is doing well she is now having pain again the calluses have returned significantly  on the medial right fifth toe.    Objective:  Physical Exam: warm, good capillary refill, no trophic changes or ulcerative lesions, normal DP and PT pulses and normal sensory exam. 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 Difficulty walking   2. Callus of foot   3. Hammertoe of left foot   4. Hammertoe of right foot         Plan:  Patient was evaluated and treated and all questions answered.  All symptomatic hyperkeratoses were safely debrided with a sterile #15 blade to patient's level of comfort without incident. We discussed preventative and palliative care of these lesions including supportive and accommodative shoegear, padding, prefabricated and custom molded accommodative orthoses, use of a pumice stone and lotions/creams daily.  Salicylic acid applied to lesions      Return in about 4 weeks (around 06/24/2021) for calluses.

## 2021-06-14 DIAGNOSIS — Z23 Encounter for immunization: Secondary | ICD-10-CM | POA: Diagnosis not present

## 2021-06-23 DIAGNOSIS — E039 Hypothyroidism, unspecified: Secondary | ICD-10-CM | POA: Diagnosis not present

## 2021-06-23 DIAGNOSIS — I10 Essential (primary) hypertension: Secondary | ICD-10-CM | POA: Diagnosis not present

## 2021-06-23 DIAGNOSIS — M810 Age-related osteoporosis without current pathological fracture: Secondary | ICD-10-CM | POA: Diagnosis not present

## 2021-06-23 DIAGNOSIS — K529 Noninfective gastroenteritis and colitis, unspecified: Secondary | ICD-10-CM | POA: Diagnosis not present

## 2021-06-23 DIAGNOSIS — M1990 Unspecified osteoarthritis, unspecified site: Secondary | ICD-10-CM | POA: Diagnosis not present

## 2021-06-23 DIAGNOSIS — G459 Transient cerebral ischemic attack, unspecified: Secondary | ICD-10-CM | POA: Diagnosis not present

## 2021-06-23 DIAGNOSIS — E785 Hyperlipidemia, unspecified: Secondary | ICD-10-CM | POA: Diagnosis not present

## 2021-06-23 DIAGNOSIS — R7301 Impaired fasting glucose: Secondary | ICD-10-CM | POA: Diagnosis not present

## 2021-06-29 ENCOUNTER — Other Ambulatory Visit: Payer: Self-pay

## 2021-06-29 ENCOUNTER — Ambulatory Visit (INDEPENDENT_AMBULATORY_CARE_PROVIDER_SITE_OTHER): Payer: Medicare Other | Admitting: Podiatry

## 2021-06-29 DIAGNOSIS — M2041 Other hammer toe(s) (acquired), right foot: Secondary | ICD-10-CM | POA: Diagnosis not present

## 2021-06-29 DIAGNOSIS — L84 Corns and callosities: Secondary | ICD-10-CM

## 2021-06-29 DIAGNOSIS — M2042 Other hammer toe(s) (acquired), left foot: Secondary | ICD-10-CM | POA: Diagnosis not present

## 2021-06-30 NOTE — Progress Notes (Signed)
  Subjective:  Patient ID: Tanya Morales, female    DOB: 07-Morales-1930,  MRN: [id number]  Chief Complaint  Patient presents with   Callouses    Callous trim     87 y.o. female returns with the above complaint. History confirmed with patient.  She is doing well she is now having pain again the calluses have returned significantly  on the medial right fifth toe.    Objective:  Physical Exam: warm, good capillary refill, no trophic changes or ulcerative lesions, normal DP and PT pulses and normal sensory exam. 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 Callus of foot   2. Hammertoe of left foot   3. Hammertoe of right foot         Plan:  Patient was evaluated and treated and all questions answered.  All symptomatic hyperkeratoses were safely debrided with a sterile #15 blade to patient's level of comfort without incident. We discussed preventative and palliative care of these lesions including supportive and accommodative shoegear, padding, prefabricated and custom molded accommodative orthoses, use of a pumice stone and lotions/creams daily.   She will be moving to [place] soon   Return in about 4 weeks (around 07/27/2021) for painful toes and calluses .

## 2021-07-01 DIAGNOSIS — H0100A Unspecified blepharitis right eye, upper and lower eyelids: Secondary | ICD-10-CM | POA: Diagnosis not present

## 2021-07-01 DIAGNOSIS — H5213 Myopia, bilateral: Secondary | ICD-10-CM | POA: Diagnosis not present

## 2021-07-01 DIAGNOSIS — Z961 Presence of intraocular lens: Secondary | ICD-10-CM | POA: Diagnosis not present

## 2021-07-07 DIAGNOSIS — L661 Lichen planopilaris: Secondary | ICD-10-CM | POA: Diagnosis not present

## 2021-07-08 ENCOUNTER — Other Ambulatory Visit (HOSPITAL_COMMUNITY): Payer: Self-pay | Admitting: *Deleted

## 2021-07-09 ENCOUNTER — Other Ambulatory Visit: Payer: Self-pay

## 2021-07-09 ENCOUNTER — Ambulatory Visit (HOSPITAL_COMMUNITY)
Admission: RE | Admit: 2021-07-09 | Discharge: 2021-07-09 | Disposition: A | Discharge status: Home / Self Care | Payer: Medicare Other | Source: Ambulatory Visit | Attending: Internal Medicine | Admitting: Internal Medicine

## 2021-07-09 DIAGNOSIS — M810 Age-related osteoporosis without current pathological fracture: Secondary | ICD-10-CM | POA: Insufficient documentation

## 2021-07-09 MED ORDER — DENOSUMAB 60 MG/ML ~~LOC~~ SOSY: 60.0000 mg | PREFILLED_SYRINGE | Freq: Once | SUBCUTANEOUS | Status: AC

## 2021-07-09 MED ORDER — DENOSUMAB 60 MG/ML ~~LOC~~ SOSY
PREFILLED_SYRINGE | SUBCUTANEOUS | Status: AC
  Administered 2021-07-09: 60 mg via SUBCUTANEOUS
  Filled 2021-07-09: qty 1

## 2021-07-21 DIAGNOSIS — Z23 Encounter for immunization: Secondary | ICD-10-CM | POA: Diagnosis not present

## 2021-07-23 DIAGNOSIS — Z23 Encounter for immunization: Secondary | ICD-10-CM | POA: Diagnosis not present

## 2021-07-26 DIAGNOSIS — H16201 Unspecified keratoconjunctivitis, right eye: Secondary | ICD-10-CM | POA: Diagnosis not present

## 2021-07-26 DIAGNOSIS — H0100A Unspecified blepharitis right eye, upper and lower eyelids: Secondary | ICD-10-CM | POA: Diagnosis not present

## 2021-07-27 ENCOUNTER — Other Ambulatory Visit: Payer: Self-pay

## 2021-07-27 ENCOUNTER — Ambulatory Visit (INDEPENDENT_AMBULATORY_CARE_PROVIDER_SITE_OTHER): Payer: Medicare Other | Admitting: Podiatry

## 2021-07-27 DIAGNOSIS — L84 Corns and callosities: Secondary | ICD-10-CM | POA: Diagnosis not present

## 2021-07-27 DIAGNOSIS — R52 Pain, unspecified: Secondary | ICD-10-CM

## 2021-07-28 NOTE — Progress Notes (Signed)
  Subjective:  Patient ID: Tanya Morales, female    DOB: Jun 21, 1929,  MRN: [id number]  Chief Complaint  Patient presents with   Callouses    4 week follow up    87 y.o. female returns with the above complaint. History confirmed with patient.  She is doing well she is now having pain again the calluses have returned significantly  on the medial right fifth toe.    Objective:  Physical Exam: warm, good capillary refill, no trophic changes or ulcerative lesions, normal DP and PT pulses and normal sensory exam. 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 Callus of foot   2. Pain         Plan:  Patient was evaluated and treated and all questions answered.  All symptomatic hyperkeratoses were safely debrided with a sterile #15 blade to patient's level of comfort without incident. We discussed preventative and palliative care of these lesions including supportive and accommodative shoegear, padding, prefabricated and custom molded accommodative orthoses, use of a pumice stone and lotions/creams daily.   She will be moving to [place] soon to live with her family   Return in about 4 weeks (around 08/24/2021) for calluses .

## 2021-08-24 ENCOUNTER — Ambulatory Visit (INDEPENDENT_AMBULATORY_CARE_PROVIDER_SITE_OTHER): Payer: Medicare Other | Admitting: Podiatry

## 2021-08-24 DIAGNOSIS — Z91199 Patient's noncompliance with other medical treatment and regimen due to unspecified reason: Secondary | ICD-10-CM

## 2021-08-25 NOTE — Progress Notes (Signed)
Patient was no-show for appointment today 

## 2021-08-26 DIAGNOSIS — H903 Sensorineural hearing loss, bilateral: Secondary | ICD-10-CM | POA: Diagnosis not present

## 2021-08-26 DIAGNOSIS — I739 Peripheral vascular disease, unspecified: Secondary | ICD-10-CM | POA: Diagnosis not present

## 2021-08-26 DIAGNOSIS — L84 Corns and callosities: Secondary | ICD-10-CM | POA: Diagnosis not present

## 2021-08-26 DIAGNOSIS — M2041 Other hammer toe(s) (acquired), right foot: Secondary | ICD-10-CM | POA: Diagnosis not present

## 2021-10-06 DIAGNOSIS — Z8673 Personal history of transient ischemic attack (TIA), and cerebral infarction without residual deficits: Secondary | ICD-10-CM | POA: Diagnosis not present

## 2021-10-06 DIAGNOSIS — I10 Essential (primary) hypertension: Secondary | ICD-10-CM | POA: Diagnosis not present

## 2021-10-06 DIAGNOSIS — L659 Nonscarring hair loss, unspecified: Secondary | ICD-10-CM | POA: Diagnosis not present

## 2021-10-06 DIAGNOSIS — E785 Hyperlipidemia, unspecified: Secondary | ICD-10-CM | POA: Diagnosis not present

## 2021-10-06 DIAGNOSIS — C50919 Malignant neoplasm of unspecified site of unspecified female breast: Secondary | ICD-10-CM | POA: Diagnosis not present

## 2021-10-06 DIAGNOSIS — E559 Vitamin D deficiency, unspecified: Secondary | ICD-10-CM | POA: Diagnosis not present

## 2021-10-06 DIAGNOSIS — E039 Hypothyroidism, unspecified: Secondary | ICD-10-CM | POA: Diagnosis not present

## 2021-10-06 DIAGNOSIS — M810 Age-related osteoporosis without current pathological fracture: Secondary | ICD-10-CM | POA: Diagnosis not present

## 2021-10-06 DIAGNOSIS — Z0001 Encounter for general adult medical examination with abnormal findings: Secondary | ICD-10-CM | POA: Diagnosis not present

## 2021-10-21 DIAGNOSIS — R2681 Unsteadiness on feet: Secondary | ICD-10-CM | POA: Diagnosis not present

## 2021-10-21 DIAGNOSIS — R54 Age-related physical debility: Secondary | ICD-10-CM | POA: Diagnosis not present

## 2021-10-21 DIAGNOSIS — R269 Unspecified abnormalities of gait and mobility: Secondary | ICD-10-CM | POA: Diagnosis not present

## 2021-10-21 DIAGNOSIS — R5383 Other fatigue: Secondary | ICD-10-CM | POA: Diagnosis not present

## 2021-10-25 DIAGNOSIS — R2681 Unsteadiness on feet: Secondary | ICD-10-CM | POA: Diagnosis not present

## 2021-10-25 DIAGNOSIS — R5383 Other fatigue: Secondary | ICD-10-CM | POA: Diagnosis not present

## 2021-10-25 DIAGNOSIS — R54 Age-related physical debility: Secondary | ICD-10-CM | POA: Diagnosis not present

## 2021-10-25 DIAGNOSIS — R269 Unspecified abnormalities of gait and mobility: Secondary | ICD-10-CM | POA: Diagnosis not present

## 2021-10-27 DIAGNOSIS — H02122 Mechanical ectropion of right lower eyelid: Secondary | ICD-10-CM | POA: Diagnosis not present

## 2021-10-27 DIAGNOSIS — H02126 Mechanical ectropion of left eye, unspecified eyelid: Secondary | ICD-10-CM | POA: Diagnosis not present

## 2021-10-28 DIAGNOSIS — I739 Peripheral vascular disease, unspecified: Secondary | ICD-10-CM | POA: Diagnosis not present

## 2021-10-28 DIAGNOSIS — M2041 Other hammer toe(s) (acquired), right foot: Secondary | ICD-10-CM | POA: Diagnosis not present

## 2021-10-28 DIAGNOSIS — L84 Corns and callosities: Secondary | ICD-10-CM | POA: Diagnosis not present

## 2021-10-29 DIAGNOSIS — R5383 Other fatigue: Secondary | ICD-10-CM | POA: Diagnosis not present

## 2021-10-29 DIAGNOSIS — R2681 Unsteadiness on feet: Secondary | ICD-10-CM | POA: Diagnosis not present

## 2021-10-29 DIAGNOSIS — R54 Age-related physical debility: Secondary | ICD-10-CM | POA: Diagnosis not present

## 2021-10-29 DIAGNOSIS — R269 Unspecified abnormalities of gait and mobility: Secondary | ICD-10-CM | POA: Diagnosis not present

## 2021-11-01 DIAGNOSIS — R2681 Unsteadiness on feet: Secondary | ICD-10-CM | POA: Diagnosis not present

## 2021-11-01 DIAGNOSIS — R269 Unspecified abnormalities of gait and mobility: Secondary | ICD-10-CM | POA: Diagnosis not present

## 2021-11-01 DIAGNOSIS — R54 Age-related physical debility: Secondary | ICD-10-CM | POA: Diagnosis not present

## 2021-11-01 DIAGNOSIS — R5383 Other fatigue: Secondary | ICD-10-CM | POA: Diagnosis not present

## 2021-11-04 DIAGNOSIS — Z0001 Encounter for general adult medical examination with abnormal findings: Secondary | ICD-10-CM | POA: Diagnosis not present

## 2021-11-04 DIAGNOSIS — E039 Hypothyroidism, unspecified: Secondary | ICD-10-CM | POA: Diagnosis not present

## 2021-11-04 DIAGNOSIS — M810 Age-related osteoporosis without current pathological fracture: Secondary | ICD-10-CM | POA: Diagnosis not present

## 2021-11-04 DIAGNOSIS — Z780 Asymptomatic menopausal state: Secondary | ICD-10-CM | POA: Diagnosis not present

## 2021-11-04 DIAGNOSIS — E785 Hyperlipidemia, unspecified: Secondary | ICD-10-CM | POA: Diagnosis not present

## 2021-11-04 DIAGNOSIS — E559 Vitamin D deficiency, unspecified: Secondary | ICD-10-CM | POA: Diagnosis not present

## 2021-11-04 DIAGNOSIS — M8589 Other specified disorders of bone density and structure, multiple sites: Secondary | ICD-10-CM | POA: Diagnosis not present

## 2021-11-10 DIAGNOSIS — R5383 Other fatigue: Secondary | ICD-10-CM | POA: Diagnosis not present

## 2021-11-10 DIAGNOSIS — R2681 Unsteadiness on feet: Secondary | ICD-10-CM | POA: Diagnosis not present

## 2021-11-10 DIAGNOSIS — R269 Unspecified abnormalities of gait and mobility: Secondary | ICD-10-CM | POA: Diagnosis not present

## 2021-11-10 DIAGNOSIS — R54 Age-related physical debility: Secondary | ICD-10-CM | POA: Diagnosis not present

## 2021-11-15 DIAGNOSIS — R2681 Unsteadiness on feet: Secondary | ICD-10-CM | POA: Diagnosis not present

## 2021-11-15 DIAGNOSIS — R269 Unspecified abnormalities of gait and mobility: Secondary | ICD-10-CM | POA: Diagnosis not present

## 2021-11-15 DIAGNOSIS — R5383 Other fatigue: Secondary | ICD-10-CM | POA: Diagnosis not present

## 2021-11-15 DIAGNOSIS — R54 Age-related physical debility: Secondary | ICD-10-CM | POA: Diagnosis not present

## 2021-11-19 DIAGNOSIS — R54 Age-related physical debility: Secondary | ICD-10-CM | POA: Diagnosis not present

## 2021-11-19 DIAGNOSIS — R5383 Other fatigue: Secondary | ICD-10-CM | POA: Diagnosis not present

## 2021-11-19 DIAGNOSIS — R269 Unspecified abnormalities of gait and mobility: Secondary | ICD-10-CM | POA: Diagnosis not present

## 2021-11-19 DIAGNOSIS — R2681 Unsteadiness on feet: Secondary | ICD-10-CM | POA: Diagnosis not present

## 2021-11-22 DIAGNOSIS — R2681 Unsteadiness on feet: Secondary | ICD-10-CM | POA: Diagnosis not present

## 2021-11-22 DIAGNOSIS — R5383 Other fatigue: Secondary | ICD-10-CM | POA: Diagnosis not present

## 2021-11-22 DIAGNOSIS — R54 Age-related physical debility: Secondary | ICD-10-CM | POA: Diagnosis not present

## 2021-11-22 DIAGNOSIS — R269 Unspecified abnormalities of gait and mobility: Secondary | ICD-10-CM | POA: Diagnosis not present

## 2021-11-25 DIAGNOSIS — R5383 Other fatigue: Secondary | ICD-10-CM | POA: Diagnosis not present

## 2021-11-25 DIAGNOSIS — R269 Unspecified abnormalities of gait and mobility: Secondary | ICD-10-CM | POA: Diagnosis not present

## 2021-11-25 DIAGNOSIS — R2681 Unsteadiness on feet: Secondary | ICD-10-CM | POA: Diagnosis not present

## 2021-11-25 DIAGNOSIS — R54 Age-related physical debility: Secondary | ICD-10-CM | POA: Diagnosis not present

## 2021-12-02 DIAGNOSIS — R269 Unspecified abnormalities of gait and mobility: Secondary | ICD-10-CM | POA: Diagnosis not present

## 2021-12-02 DIAGNOSIS — R2681 Unsteadiness on feet: Secondary | ICD-10-CM | POA: Diagnosis not present

## 2021-12-02 DIAGNOSIS — R54 Age-related physical debility: Secondary | ICD-10-CM | POA: Diagnosis not present

## 2021-12-02 DIAGNOSIS — R5383 Other fatigue: Secondary | ICD-10-CM | POA: Diagnosis not present

## 2021-12-09 DIAGNOSIS — R2681 Unsteadiness on feet: Secondary | ICD-10-CM | POA: Diagnosis not present

## 2021-12-09 DIAGNOSIS — R5383 Other fatigue: Secondary | ICD-10-CM | POA: Diagnosis not present

## 2021-12-09 DIAGNOSIS — R54 Age-related physical debility: Secondary | ICD-10-CM | POA: Diagnosis not present

## 2021-12-09 DIAGNOSIS — R269 Unspecified abnormalities of gait and mobility: Secondary | ICD-10-CM | POA: Diagnosis not present

## 2021-12-16 DIAGNOSIS — R2681 Unsteadiness on feet: Secondary | ICD-10-CM | POA: Diagnosis not present

## 2021-12-16 DIAGNOSIS — R269 Unspecified abnormalities of gait and mobility: Secondary | ICD-10-CM | POA: Diagnosis not present

## 2021-12-16 DIAGNOSIS — R5383 Other fatigue: Secondary | ICD-10-CM | POA: Diagnosis not present

## 2021-12-16 DIAGNOSIS — R54 Age-related physical debility: Secondary | ICD-10-CM | POA: Diagnosis not present

## 2022-01-14 DIAGNOSIS — E039 Hypothyroidism, unspecified: Secondary | ICD-10-CM | POA: Diagnosis not present

## 2022-01-14 DIAGNOSIS — I160 Hypertensive urgency: Secondary | ICD-10-CM | POA: Diagnosis not present

## 2022-01-14 DIAGNOSIS — J9811 Atelectasis: Secondary | ICD-10-CM | POA: Diagnosis not present

## 2022-01-14 DIAGNOSIS — Z96649 Presence of unspecified artificial hip joint: Secondary | ICD-10-CM | POA: Diagnosis not present

## 2022-01-14 DIAGNOSIS — I350 Nonrheumatic aortic (valve) stenosis: Secondary | ICD-10-CM | POA: Diagnosis not present

## 2022-01-14 DIAGNOSIS — Z7982 Long term (current) use of aspirin: Secondary | ICD-10-CM | POA: Diagnosis not present

## 2022-01-14 DIAGNOSIS — R072 Precordial pain: Secondary | ICD-10-CM | POA: Diagnosis not present

## 2022-01-14 DIAGNOSIS — Z853 Personal history of malignant neoplasm of breast: Secondary | ICD-10-CM | POA: Diagnosis not present

## 2022-01-14 DIAGNOSIS — Z8673 Personal history of transient ischemic attack (TIA), and cerebral infarction without residual deficits: Secondary | ICD-10-CM | POA: Diagnosis not present

## 2022-01-14 DIAGNOSIS — Z90710 Acquired absence of both cervix and uterus: Secondary | ICD-10-CM | POA: Diagnosis not present

## 2022-01-14 DIAGNOSIS — Z87891 Personal history of nicotine dependence: Secondary | ICD-10-CM | POA: Diagnosis not present

## 2022-01-14 DIAGNOSIS — H8110 Benign paroxysmal vertigo, unspecified ear: Secondary | ICD-10-CM | POA: Diagnosis not present

## 2022-01-14 DIAGNOSIS — R42 Dizziness and giddiness: Secondary | ICD-10-CM | POA: Diagnosis not present

## 2022-01-14 DIAGNOSIS — Z9221 Personal history of antineoplastic chemotherapy: Secondary | ICD-10-CM | POA: Diagnosis not present

## 2022-01-14 DIAGNOSIS — I447 Left bundle-branch block, unspecified: Secondary | ICD-10-CM | POA: Diagnosis not present

## 2022-01-14 DIAGNOSIS — Z79899 Other long term (current) drug therapy: Secondary | ICD-10-CM | POA: Diagnosis not present

## 2022-01-14 DIAGNOSIS — M810 Age-related osteoporosis without current pathological fracture: Secondary | ICD-10-CM | POA: Diagnosis not present

## 2022-01-14 DIAGNOSIS — I10 Essential (primary) hypertension: Secondary | ICD-10-CM | POA: Diagnosis not present

## 2022-01-14 DIAGNOSIS — E785 Hyperlipidemia, unspecified: Secondary | ICD-10-CM | POA: Diagnosis not present

## 2022-01-14 DIAGNOSIS — R2681 Unsteadiness on feet: Secondary | ICD-10-CM | POA: Diagnosis not present

## 2022-01-19 DIAGNOSIS — I672 Cerebral atherosclerosis: Secondary | ICD-10-CM | POA: Diagnosis not present

## 2022-01-19 DIAGNOSIS — E041 Nontoxic single thyroid nodule: Secondary | ICD-10-CM | POA: Diagnosis not present

## 2022-01-19 DIAGNOSIS — I700 Atherosclerosis of aorta: Secondary | ICD-10-CM | POA: Diagnosis not present

## 2022-01-19 DIAGNOSIS — I779 Disorder of arteries and arterioles, unspecified: Secondary | ICD-10-CM | POA: Diagnosis not present

## 2022-01-19 DIAGNOSIS — I6529 Occlusion and stenosis of unspecified carotid artery: Secondary | ICD-10-CM | POA: Diagnosis not present

## 2022-01-19 DIAGNOSIS — R079 Chest pain, unspecified: Secondary | ICD-10-CM | POA: Diagnosis not present

## 2022-01-19 DIAGNOSIS — I447 Left bundle-branch block, unspecified: Secondary | ICD-10-CM | POA: Diagnosis not present

## 2022-01-19 DIAGNOSIS — E785 Hyperlipidemia, unspecified: Secondary | ICD-10-CM | POA: Diagnosis not present

## 2022-02-17 DIAGNOSIS — I6522 Occlusion and stenosis of left carotid artery: Secondary | ICD-10-CM | POA: Diagnosis not present

## 2022-02-17 DIAGNOSIS — I447 Left bundle-branch block, unspecified: Secondary | ICD-10-CM | POA: Diagnosis not present

## 2022-02-17 DIAGNOSIS — I10 Essential (primary) hypertension: Secondary | ICD-10-CM | POA: Diagnosis not present

## 2022-02-17 DIAGNOSIS — E782 Mixed hyperlipidemia: Secondary | ICD-10-CM | POA: Diagnosis not present
